# Patient Record
Sex: Male | Born: 1950
Health system: Southern US, Community
[De-identification: ages and names within clinical notes are randomized; demographics above are authoritative.]

## PROBLEM LIST (undated history)

## (undated) DIAGNOSIS — R7303 Prediabetes: Secondary | ICD-10-CM

## (undated) DIAGNOSIS — I10 Essential (primary) hypertension: Secondary | ICD-10-CM

## (undated) DIAGNOSIS — R972 Elevated prostate specific antigen [PSA]: Secondary | ICD-10-CM

## (undated) DIAGNOSIS — Z87442 Personal history of urinary calculi: Secondary | ICD-10-CM

## (undated) DIAGNOSIS — E785 Hyperlipidemia, unspecified: Secondary | ICD-10-CM

## (undated) DIAGNOSIS — N200 Calculus of kidney: Secondary | ICD-10-CM

## (undated) HISTORY — PX: INGUINAL HERNIA REPAIR: SUR1180

## (undated) HISTORY — DX: Hyperlipidemia, unspecified: E78.5

## (undated) HISTORY — PX: WISDOM TOOTH EXTRACTION: SHX21

## (undated) HISTORY — DX: Calculus of kidney: N20.0

---

## 2001-09-07 ENCOUNTER — Ambulatory Visit (HOSPITAL_COMMUNITY): Admission: RE | Admit: 2001-09-07 | Discharge: 2001-09-07 | Payer: Self-pay | Admitting: General Surgery

## 2006-04-06 ENCOUNTER — Ambulatory Visit: Payer: Self-pay | Admitting: Family Medicine

## 2006-06-20 ENCOUNTER — Ambulatory Visit: Payer: Self-pay | Admitting: Family Medicine

## 2006-08-11 ENCOUNTER — Ambulatory Visit: Payer: Self-pay | Admitting: Family Medicine

## 2006-10-21 ENCOUNTER — Ambulatory Visit: Payer: Self-pay | Admitting: Family Medicine

## 2007-01-17 ENCOUNTER — Ambulatory Visit: Payer: Self-pay | Admitting: Family Medicine

## 2007-03-14 ENCOUNTER — Ambulatory Visit: Payer: Self-pay | Admitting: Family Medicine

## 2007-04-27 HISTORY — PX: COLONOSCOPY: SHX174

## 2008-03-27 ENCOUNTER — Ambulatory Visit: Payer: Self-pay | Admitting: Family Medicine

## 2008-11-05 ENCOUNTER — Ambulatory Visit: Payer: Self-pay | Admitting: Family Medicine

## 2009-11-17 ENCOUNTER — Ambulatory Visit: Payer: Self-pay | Admitting: Family Medicine

## 2010-04-16 ENCOUNTER — Ambulatory Visit: Payer: Self-pay | Admitting: Family Medicine

## 2010-09-11 NOTE — Op Note (Signed)
[facility]  Patient:    Adam Rosales, Adam Rosales Visit Number: [id number] MRN: [id number]          Service Type: DSU Location: DAY Attending Physician:  Brandy Hale Dictated by:   Angelia Mould. Derrell Lolling, M.D. Proc. Date: 09/07/01 Admit Date:  09/07/2001   CC:         Ronnald Nian, M.D.   Operative Report  PREOPERATIVE DIAGNOSIS:  Bilateral inguinal hernias.  POSTOPERATIVE DIAGNOSIS:  Bilateral inguinal hernias.  OPERATION PERFORMED:  Laparoscopic preperitoneal repair of bilateral inguinal hernias with mesh.  SURGEON:  Angelia Mould. Derrell Lolling, M.D.  FIRST ASSISTANT:  Abigail Miyamoto, M.D.  INDICATIONS FOR PROCEDURE:  This is a 60 year old gentleman who presents with a recent history of a painful bulge in his right groin. This has been progressive. On exam, he actually has bilateral inguinal hernias that are completely reducible. He is brought to the operating room electively.  OPERATIVE FINDINGS:  The patient had a large indirect hernia on the right side and on the left side he had both and indirect and a direct hernia. These were simple hernias. There was no sliding component or evidence of incarceration.  DESCRIPTION OF PROCEDURE:  Following the induction of general endotracheal anesthesia, the patients bladder was emptied with a Foley catheter. The abdomen and genitalia were prepped and draped in a sterile fashion. A transverse incision was made at the lower end of the umbilicus. The fascia was incised transversely exposing the medial border of the right rectus muscle. A balloon dissector was passed gently and bluntly behind the right rectus muscle down to the symphysis pubis. The video camera was inserted and the dissector balloon was inflated under direct vision and held in place for about four minutes. We had reasonably good dissection centrally but laterally it was not completely dissected. The dissector balloon was removed. The  insufflating trocar was inflated and secured. The preperitoneal space was insufflated at 12 mmHg. We placed a 5 mm trocar in the infraumbilical midline just below the camera trocar and through that port, we took down some adhesions and dissected the peritoneum away from the lateral abdominal wall bilaterally. We inserted a 5 mm trocar in the right lower quadrant above the level of the anterior superior iliac spine.  We then dissected bilaterally. We identified the symphysis pubis and Coopers ligaments on both side. We identified the iliac vessels and inferior epigastric vessels. We dissected a direct hernia sac on the left side and the pseudosac was allowed to retract. We then dissected large indirect hernia sacs away from the cord structures bilaterally, this was done primarily with blunt dissection. We isolated the cord structures identifying the vas deferens and the testicular vessels and then dissected the indirect sac all the way back to where they were above the level of the anterior superior iliac spine.  On the right side, we repaired the hernia with a 4 inch x 6 inch piece of polypropylene mesh. On the left side, we used a pre-cut convex bard mesh, large size.  On both sides, we overlapped the midline just a little bit and secured the mesh to the superior rim of the symphysis pubis and then along the superior rim of Coopers ligament, up along the midline and then laterally above the iliopubic tract. This was done with a 5 mm tacker. Laterally, we were very careful to be able to to palpate the tacking device through the skin so as to avoid injury to sensory nerves. The mesh was overlapped  in the midline slightly and both pieces of mesh appeared to cover both the direct and indirect spaces quite generously. We had good lateral coverage as well on both sides.  There was no bleeding, everything looked fine. The trocars were removed, the pneumoperitoneum was released. The fascia  at the umbilicus was closed with interrupted figure-of-eight sutures of #0 Vicryl. The skin incisions were closed with subcuticular sutures of 4-0 Vicryl and Steri-Strips. Clean bandages were placed and the patient taken to the recovery room in stable condition. Estimated blood loss was about 20 cc. Complications none. Sponge, needle and instrument counts were correct. Dictated by:   Angelia Mould. Derrell Lolling, M.D. Attending Physician:  Brandy Hale DD:  09/07/01 TD:  09/08/01 Job: [id number]

## 2010-11-30 ENCOUNTER — Encounter: Payer: Self-pay | Admitting: Family Medicine

## 2010-12-01 ENCOUNTER — Encounter: Payer: Self-pay | Admitting: Family Medicine

## 2010-12-01 ENCOUNTER — Ambulatory Visit (INDEPENDENT_AMBULATORY_CARE_PROVIDER_SITE_OTHER): Payer: BC Managed Care – PPO | Admitting: Family Medicine

## 2010-12-01 VITALS — BP 120/84 | HR 66 | Ht 70.0 in | Wt 214.0 lb

## 2010-12-01 DIAGNOSIS — Z0000 Encounter for general adult medical examination without abnormal findings: Secondary | ICD-10-CM

## 2010-12-01 DIAGNOSIS — E785 Hyperlipidemia, unspecified: Secondary | ICD-10-CM

## 2010-12-01 DIAGNOSIS — Z8042 Family history of malignant neoplasm of prostate: Secondary | ICD-10-CM

## 2010-12-01 LAB — POCT URINALYSIS DIPSTICK
Blood, UA: NEGATIVE
Nitrite, UA: NEGATIVE
Protein, UA: NEGATIVE
Urobilinogen, UA: NEGATIVE
pH, UA: 7

## 2010-12-01 MED ORDER — ATORVASTATIN CALCIUM 20 MG PO TABS: 20.0000 mg | ORAL_TABLET | Freq: Every day | ORAL | Status: DC

## 2010-12-01 NOTE — Progress Notes (Signed)
  Subjective:    Patient ID: Adam Rosales, male    DOB: [birth year], 60 y.o.   MRN: [id number]  HPI He is here for a complete examination. He has had some difficulty with urinary frequency but this bothers him mainly during the day. He does state that the urine is relatively clear and advanced to drinking lots of fluids. He has no other concerns or complaints He continues on his Lipitor as well as multivitamins, omega-3 and aspirin. His work and home life are going well. He does not smoke and rarely drinks. Family history significant for prostate cancer. He is also made some exercise and dietary changes and has lost a significant amount of weight which is quite happy with.   Review of Systems  Constitutional: Negative.   HENT: Negative.   Eyes: Negative.   Respiratory: Negative.   Cardiovascular: Negative.   Gastrointestinal: Negative.   Genitourinary: Positive for frequency.  Musculoskeletal: Negative.   Skin: Negative.   Neurological: Negative.   Hematological: Negative.   Psychiatric/Behavioral: Negative.        Objective:   Physical Exam BP 120/84  Pulse 66  Ht 5\' 10"  (1.778 m)  Wt 214 lb (97.07 kg)  BMI 30.71 kg/m2  General Appearance:    Alert, cooperative, no distress, appears stated age  Head:    Normocephalic, without obvious abnormality, atraumatic  Eyes:    PERRL, conjunctiva/corneas clear, EOM's intact, fundi    benign  Ears:    Normal TM's and external ear canals  Nose:   Nares normal, mucosa normal, no drainage or sinus   tenderness  Throat:   Lips, mucosa, and tongue normal; teeth and gums normal  Neck:   Supple, no lymphadenopathy;  thyroid:  no   enlargement/tenderness/nodules; no carotid   bruit or JVD  Back:    Spine nontender, no curvature, ROM normal, no CVA     tenderness  Lungs:     Clear to auscultation bilaterally without wheezes, rales or     ronchi; respirations unlabored  Chest Wall:    No tenderness or deformity   Heart:    Regular rate and  rhythm, S1 and S2 normal, no murmur, rub   or gallop  Breast Exam:    No chest wall tenderness, masses or gynecomastia  Abdomen:     Soft, non-tender, nondistended, normoactive bowel sounds,    no masses, no hepatosplenomegaly  Genitalia:    Normal male external genitalia without lesions.  Testicles without masses.  No inguinal hernias.  Rectal:    Normal sphincter tone, no masses or tenderness; guaiac negative stool.  Prostate smooth, no nodules, not enlarged.  Extremities:   No clubbing, cyanosis or edema  Pulses:   2+ and symmetric all extremities  Skin:   Skin color, texture, turgor normal, no rashes or lesions  Lymph nodes:   Cervical, supraclavicular, and axillary nodes normal  Neurologic:   CNII-XII intact, normal strength, sensation and gait; reflexes 2+ and symmetric throughout          Psych:   Normal mood, affect, hygiene and grooming.           Assessment & Plan:  Hyperlipidemia. Family history of prostate cancer. Scheduled weight loss. Routine blood screening. Lipitor will be renewed

## 2010-12-02 ENCOUNTER — Telehealth: Payer: Self-pay

## 2010-12-02 LAB — CBC WITH DIFFERENTIAL/PLATELET
Eosinophils Absolute: 0.5 10*3/uL (ref 0.0–0.7)
Eosinophils Relative: 8 % — ABNORMAL HIGH (ref 0–5)
Lymphs Abs: 2.1 10*3/uL (ref 0.7–4.0)
MCH: 31 pg (ref 26.0–34.0)
MCV: 93.1 fL (ref 78.0–100.0)
Platelets: 249 10*3/uL (ref 150–400)
RBC: 4.78 MIL/uL (ref 4.22–5.81)
RDW: 13.5 % (ref 11.5–15.5)

## 2010-12-02 LAB — LIPID PANEL
Cholesterol: 114 mg/dL (ref 0–200)
HDL: 38 mg/dL — ABNORMAL LOW (ref >39)
Total CHOL/HDL Ratio: 3 Ratio
Triglycerides: 89 mg/dL (ref <150)
VLDL: 18 mg/dL (ref 0–40)

## 2010-12-02 LAB — COMPREHENSIVE METABOLIC PANEL
ALT: 15 U/L (ref 0–53)
CO2: 28 mEq/L (ref 19–32)
Creat: 0.82 mg/dL (ref 0.50–1.35)
Total Bilirubin: 0.7 mg/dL (ref 0.3–1.2)

## 2010-12-02 NOTE — Telephone Encounter (Signed)
Left word with daughter about labs

## 2011-07-05 ENCOUNTER — Other Ambulatory Visit (INDEPENDENT_AMBULATORY_CARE_PROVIDER_SITE_OTHER): Payer: BC Managed Care – PPO

## 2011-07-05 DIAGNOSIS — Z23 Encounter for immunization: Secondary | ICD-10-CM

## 2011-07-05 DIAGNOSIS — Z2911 Encounter for prophylactic immunotherapy for respiratory syncytial virus (RSV): Secondary | ICD-10-CM

## 2011-12-27 ENCOUNTER — Other Ambulatory Visit: Payer: Self-pay | Admitting: Family Medicine

## 2012-02-01 ENCOUNTER — Other Ambulatory Visit: Payer: Self-pay | Admitting: Family Medicine

## 2012-02-04 ENCOUNTER — Telehealth: Payer: Self-pay | Admitting: Internal Medicine

## 2012-02-04 MED ORDER — ATORVASTATIN CALCIUM 20 MG PO TABS: 20.0000 mg | ORAL_TABLET | Freq: Every day | ORAL | Status: DC

## 2012-02-04 NOTE — Telephone Encounter (Signed)
Me sent in 

## 2012-02-24 ENCOUNTER — Ambulatory Visit (INDEPENDENT_AMBULATORY_CARE_PROVIDER_SITE_OTHER): Payer: BC Managed Care – PPO | Admitting: Family Medicine

## 2012-02-24 VITALS — BP 130/80 | HR 74 | Wt 250.0 lb

## 2012-02-24 DIAGNOSIS — E782 Mixed hyperlipidemia: Secondary | ICD-10-CM | POA: Insufficient documentation

## 2012-02-24 DIAGNOSIS — E785 Hyperlipidemia, unspecified: Secondary | ICD-10-CM | POA: Insufficient documentation

## 2012-02-24 DIAGNOSIS — L259 Unspecified contact dermatitis, unspecified cause: Secondary | ICD-10-CM

## 2012-02-24 MED ORDER — METHYLPREDNISOLONE SODIUM SUCC 125 MG IJ SOLR: 125.0000 mg | Freq: Once | INTRAMUSCULAR | Status: DC

## 2012-02-24 MED ORDER — ATORVASTATIN CALCIUM 20 MG PO TABS: 20.0000 mg | ORAL_TABLET | Freq: Every day | ORAL | Status: DC

## 2012-02-24 MED ORDER — METHYLPREDNISOLONE SODIUM SUCC 125 MG IJ SOLR
125.0000 mg | Freq: Once | INTRAMUSCULAR | Status: AC
  Administered 2012-02-24: 125 mg via INTRAMUSCULAR

## 2012-02-24 NOTE — Progress Notes (Signed)
  Subjective:    Patient ID: Adam Rosales, male    DOB: April 15, 1951, 61 y.o.   MRN: [id number]  HPI He is here for consultation concerning a rash present mainly on his abdomen. He has no exposure to new chemicals, soaps, detergents. He did do some yard work recently. He also would like a refill on his Lipitor. He does have an appointment scheduled in the near future.  Review of Systems     Objective:   Physical Exam Diffuse erythematous confluent rash on the abdomen with clear delineation and a diagonal pattern bilaterally. He also has an erythematous type rash present on his right forearm.       Assessment & Plan:   1. Contact dermatitis  methylPREDNISolone sodium succinate (SOLU-MEDROL) 125 mg/2 mL injection 125 mg, DISCONTINUED: methylPREDNISolone sodium succinate (SOLU-MEDROL) 125 mg/2 mL injection  2. Hyperlipidemia LDL goal < 100  atorvastatin (LIPITOR) 20 MG tablet  The rash is mostly consistent with a contact irritation. If no improvement with the steroid, referral will be made.

## 2012-02-24 NOTE — Patient Instructions (Signed)
Use Lotrisone on the genital lesions. He can take Benadryl at night for the itching

## 2012-03-07 ENCOUNTER — Encounter: Payer: Self-pay | Admitting: Internal Medicine

## 2012-03-09 ENCOUNTER — Encounter: Payer: BC Managed Care – PPO | Admitting: Family Medicine

## 2012-03-15 ENCOUNTER — Ambulatory Visit (INDEPENDENT_AMBULATORY_CARE_PROVIDER_SITE_OTHER): Payer: BC Managed Care – PPO | Admitting: Family Medicine

## 2012-03-15 ENCOUNTER — Encounter: Payer: Self-pay | Admitting: Family Medicine

## 2012-03-15 VITALS — BP 116/78 | HR 60

## 2012-03-15 DIAGNOSIS — E785 Hyperlipidemia, unspecified: Secondary | ICD-10-CM

## 2012-03-15 DIAGNOSIS — E669 Obesity, unspecified: Secondary | ICD-10-CM | POA: Insufficient documentation

## 2012-03-15 DIAGNOSIS — Z0000 Encounter for general adult medical examination without abnormal findings: Secondary | ICD-10-CM

## 2012-03-15 DIAGNOSIS — Z8042 Family history of malignant neoplasm of prostate: Secondary | ICD-10-CM

## 2012-03-15 LAB — HEMOCCULT GUIAC POC 1CARD (OFFICE)

## 2012-03-15 LAB — CBC WITH DIFFERENTIAL/PLATELET
Basophils Relative: 1 % (ref 0–1)
Eosinophils Relative: 5 % (ref 0–5)
HCT: 48.7 % (ref 39.0–52.0)
Hemoglobin: 17.4 g/dL — ABNORMAL HIGH (ref 13.0–17.0)
MCH: 32.2 pg (ref 26.0–34.0)
MCHC: 35.7 g/dL (ref 30.0–36.0)
MCV: 90 fL (ref 78.0–100.0)
Monocytes Absolute: 0.5 10*3/uL (ref 0.1–1.0)
Monocytes Relative: 9 % (ref 3–12)
Neutro Abs: 2.8 10*3/uL (ref 1.7–7.7)

## 2012-03-15 LAB — POCT URINALYSIS DIPSTICK
Ketones, UA: NEGATIVE
Leukocytes, UA: NEGATIVE
Protein, UA: NEGATIVE
Urobilinogen, UA: NEGATIVE
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
## 2012-03-16 LAB — COMPREHENSIVE METABOLIC PANEL
Albumin: 4.6 g/dL (ref 3.5–5.2)
Alkaline Phosphatase: 57 U/L (ref 39–117)
BUN: 10 mg/dL (ref 6–23)
Calcium: 9.3 mg/dL (ref 8.4–10.5)
Chloride: 105 mEq/L (ref 96–112)
Glucose, Bld: 91 mg/dL (ref 70–99)
Potassium: 4.3 mEq/L (ref 3.5–5.3)

## 2012-03-16 LAB — LIPID PANEL
Cholesterol: 121 mg/dL (ref 0–200)
Total CHOL/HDL Ratio: 3.5 Ratio

## 2012-03-16 NOTE — Progress Notes (Signed)
Quick Note:  Called pt home # left message labs including PSA look good ______

## 2012-03-30 ENCOUNTER — Other Ambulatory Visit: Payer: Self-pay | Admitting: Family Medicine

## 2012-05-01 ENCOUNTER — Ambulatory Visit (INDEPENDENT_AMBULATORY_CARE_PROVIDER_SITE_OTHER): Payer: BC Managed Care – PPO | Admitting: Family Medicine

## 2012-05-01 ENCOUNTER — Encounter: Payer: Self-pay | Admitting: Family Medicine

## 2012-05-01 VITALS — BP 136/80 | HR 58 | Temp 98.5°F | Wt 245.0 lb

## 2012-05-01 DIAGNOSIS — J069 Acute upper respiratory infection, unspecified: Secondary | ICD-10-CM

## 2012-05-01 NOTE — Progress Notes (Signed)
  Subjective:    Patient ID: Adam Rosales, male    DOB: 1950/06/08, 62 y.o.   MRN: [id number]  HPI 4 days ago he started with a slight sore throat followed by dry cough, runny nose, fatigue. He had the start on a flight back from [place].  Review of Systems     Objective:   Physical Exam alert and in no distress. Tympanic membranes and canals are normal. Throat is clear. Tonsils are normal. Neck is supple without adenopathy or thyromegaly. Cardiac exam shows a regular sinus rhythm without murmurs or gallops. Lungs are clear to auscultation.        Assessment & Plan:   1. URI, acute    recommend supportive care. Call after 7-10 days if no improvement.

## 2012-11-20 ENCOUNTER — Ambulatory Visit (INDEPENDENT_AMBULATORY_CARE_PROVIDER_SITE_OTHER): Payer: BC Managed Care – PPO | Admitting: Family Medicine

## 2012-11-20 VITALS — BP 130/98 | HR 79 | Temp 97.9°F | Resp 18 | Ht 71.0 in | Wt 249.2 lb

## 2012-11-20 DIAGNOSIS — S91109A Unspecified open wound of unspecified toe(s) without damage to nail, initial encounter: Secondary | ICD-10-CM

## 2012-11-20 DIAGNOSIS — S91209A Unspecified open wound of unspecified toe(s) with damage to nail, initial encounter: Secondary | ICD-10-CM

## 2012-11-20 DIAGNOSIS — L603 Nail dystrophy: Secondary | ICD-10-CM

## 2012-11-20 DIAGNOSIS — L608 Other nail disorders: Secondary | ICD-10-CM

## 2012-11-20 DIAGNOSIS — M79674 Pain in right toe(s): Secondary | ICD-10-CM

## 2012-11-20 DIAGNOSIS — M79609 Pain in unspecified limb: Secondary | ICD-10-CM

## 2012-11-20 NOTE — Progress Notes (Signed)
Procedure:  Consent obtained.  2% lido digital block for anesthesia.  Betadine prep - nail removed - xeroform gauze placed on nail bed.  Drsg placed.  Wound care d/w pt.

## 2012-11-20 NOTE — Progress Notes (Signed)
   [address]   [place], [place]  [address]   [phone]  Subjective:    Patient ID: Adam Rosales, male    DOB: 1950-11-16, 62 y.o.   MRN: [id number]  HPI This 62 y.o. male presents for evaluation of R first digit toe pain due to avulsion partial of nail.  Got up last night and stubbed toe; +nail lifted up and off. Pt cut off distal part of nail; remaining nail lifts off of nailbed.  Tender.  Would like to have remaining nail resected.  No toe swelling.  +bleeding last night mild.  +dystrophic nail chronic; concerned about fungus.  Applied topical antifungal cream in past with temporary improvement.    Review of Systems  Constitutional: Negative for chills, diaphoresis and fatigue.  Skin: Positive for color change and wound.    Past Medical History  Diagnosis Date  . Dyslipidemia     Past Surgical History  Procedure Laterality Date  . Colonoscopy  2009    Dr.Mann  . Hernia repair      Prior to Admission medications   Medication Sig Start Date End Date Taking? Authorizing Provider  aspirin 81 MG tablet Take 81 mg by mouth daily.     Yes Historical Provider, MD  atorvastatin (LIPITOR) 20 MG tablet TAKE ONE TABLET BY MOUTH DAILY 03/30/12  Yes Ronnald Nian, MD  beta carotene w/minerals (OCUVITE) tablet Take 1 tablet by mouth daily.   Yes Historical Provider, MD  fish oil-omega-3 fatty acids 1000 MG capsule Take 2 g by mouth daily.     Yes Historical Provider, MD    No Known Allergies  History   Social History  . Marital Status: Married    Spouse Name: N/A    Number of Children: N/A  . Years of Education: N/A   Occupational History  . Not on file.   Social History Main Topics  . Smoking status: Never Smoker   . Smokeless tobacco: Never Used  . Alcohol Use: Yes     Comment: rare  . Drug Use: No  . Sexually Active: Yes   Other Topics Concern  . Not on file   Social History Narrative  . No narrative on file    Family History  Problem Relation Age of  Onset  . Heart disease Father 8    MI  . Cancer Brother     Prostate and colon       Objective:   Physical Exam  Nursing note and vitals reviewed. Constitutional: He appears well-developed and well-nourished.  Skin:  R first toe with dystrophic nail with nail avulsion and residual blood.  Psychiatric: He has a normal mood and affect. His behavior is normal.   PROCEDURE: SEE SEPARATE PROCEDURE NOTE.    Assessment & Plan:  Pain in toe of right foot  Dystrophic nail - Plan: Culture, fungus without smear  Toenail avulsion, initial encounter  1. Pain toe R first:  New. Secondary to partial nail avulsion.  Recommend Tylenol or Motrin. 2.  Dystrophic nail R first toe:  New.  Send fungal culture. 3.  Avulsion R toenail first:  New.  S/p remaining toenail resection; local wound care.

## 2012-11-20 NOTE — Patient Instructions (Addendum)
INGROWN TOENAIL . Keep area clean, dry and bandaged for 24 hours. . After 24 hours, remove outer bandage and leave yellow gauze in place. . Soak toe/foot in warm soapy water for 5-10 minutes, once daily for 5 days. Rebandage toe after each cleaning. . Continue soaks until yellow gauze falls off. . Notify the office if you experience any of the following signs of infection: Swelling, redness, pus drainage, streaking, fever > 101.0 F   

## 2013-01-31 ENCOUNTER — Other Ambulatory Visit: Payer: Self-pay

## 2013-01-31 MED ORDER — ATORVASTATIN CALCIUM 20 MG PO TABS: ORAL_TABLET | ORAL | Status: DC

## 2013-01-31 NOTE — Telephone Encounter (Signed)
SENT IN LIPITOR PT NEEDS MED CHECK

## 2013-06-24 ENCOUNTER — Encounter (HOSPITAL_COMMUNITY): Payer: Self-pay | Admitting: Emergency Medicine

## 2013-06-24 ENCOUNTER — Emergency Department (HOSPITAL_COMMUNITY)
Admission: EM | Admit: 2013-06-24 | Discharge: 2013-06-24 | Disposition: A | Discharge status: Home / Self Care | Payer: BC Managed Care – PPO | Attending: Emergency Medicine | Admitting: Emergency Medicine

## 2013-06-24 DIAGNOSIS — Z7982 Long term (current) use of aspirin: Secondary | ICD-10-CM | POA: Insufficient documentation

## 2013-06-24 DIAGNOSIS — I493 Ventricular premature depolarization: Secondary | ICD-10-CM

## 2013-06-24 DIAGNOSIS — Z79899 Other long term (current) drug therapy: Secondary | ICD-10-CM | POA: Insufficient documentation

## 2013-06-24 DIAGNOSIS — I4949 Other premature depolarization: Secondary | ICD-10-CM | POA: Insufficient documentation

## 2013-06-24 DIAGNOSIS — R55 Syncope and collapse: Secondary | ICD-10-CM | POA: Insufficient documentation

## 2013-06-24 DIAGNOSIS — E860 Dehydration: Secondary | ICD-10-CM | POA: Insufficient documentation

## 2013-06-24 DIAGNOSIS — E785 Hyperlipidemia, unspecified: Secondary | ICD-10-CM | POA: Insufficient documentation

## 2013-06-24 LAB — BASIC METABOLIC PANEL
BUN: 11 mg/dL (ref 6–23)
CO2: 24 meq/L (ref 19–32)
Calcium: 8.7 mg/dL (ref 8.4–10.5)
Chloride: 102 mEq/L (ref 96–112)
Creatinine, Ser: 0.98 mg/dL (ref 0.50–1.35)
GFR calc Af Amer: >90 mL/min (ref >90)
GFR calc non Af Amer: 86 mL/min — ABNORMAL LOW (ref >90)
GLUCOSE: 113 mg/dL — AB (ref 70–99)
Potassium: 3.9 mEq/L (ref 3.7–5.3)
SODIUM: 140 meq/L (ref 137–147)

## 2013-06-24 LAB — I-STAT CHEM 8, ED
BUN: 10 mg/dL (ref 6–23)
CREATININE: 1 mg/dL (ref 0.50–1.35)
Calcium, Ion: 1.08 mmol/L — ABNORMAL LOW (ref 1.13–1.30)
Chloride: 108 mEq/L (ref 96–112)
Glucose, Bld: 112 mg/dL — ABNORMAL HIGH (ref 70–99)
HCT: 49 % (ref 39.0–52.0)
Hemoglobin: 16.7 g/dL (ref 13.0–17.0)
POTASSIUM: 3.4 meq/L — AB (ref 3.7–5.3)
Sodium: 140 mEq/L (ref 137–147)
TCO2: 26 mmol/L (ref 0–100)

## 2013-06-24 LAB — CBC
HCT: 47.1 % (ref 39.0–52.0)
HEMOGLOBIN: 17.4 g/dL — AB (ref 13.0–17.0)
MCH: 32.7 pg (ref 26.0–34.0)
MCHC: 36.9 g/dL — ABNORMAL HIGH (ref 30.0–36.0)
MCV: 88.5 fL (ref 78.0–100.0)
Platelets: 239 10*3/uL (ref 150–400)
RBC: 5.32 MIL/uL (ref 4.22–5.81)
RDW: 12.7 % (ref 11.5–15.5)
WBC: 9 10*3/uL (ref 4.0–10.5)

## 2013-06-24 NOTE — ED Notes (Signed)
Pt ambulated in hallway, denies dizziness/lightheadedness with ambulating. MD notified.

## 2013-06-24 NOTE — ED Notes (Signed)
Pt was sitting down at church and suddenly felt lightheaded, sweaty, and tingling in his R hand. He told his wife he felt he may pass out, she said he looked clammy at the time but never lost consciousness. He states symptoms are better now but still "feels like somethings not right." he is a&ox4, denies pain

## 2013-06-24 NOTE — ED Notes (Addendum)
Pt reports while he was sitting in church, he started to feel lightheaded, became sweaty had tingling in right and left hand but more in the right. Denies nausea feeling. Pt reports he felt like he had stood up too quickly but he was sitting. Denies LOC.  Now pt reports he feels a lot better just some generalized weakness. Nad, skin warm and dry, resp e/u. No facial droop, no slurred speech, no difficulties ambulating, equal hand grips.

## 2013-06-24 NOTE — ED Notes (Signed)
Pt reports he is feeling a little better, sts that he is just a little tired. Reports he didn't get any sleep last night.

## 2013-06-24 NOTE — Discharge Instructions (Signed)
The testing today indicated mild dehydration that is likely nutritional, in origin. Try to eat regularly, and get plenty of rest. There were a few PVCs on the EKG and cardiac monitor. These are typically a benign condition. Discuss these issues with your primary care doctor, when you see him, in a couple of weeks.    Dehydration, Adult Dehydration is when you lose more fluids from the body than you take in. Vital organs like the kidneys, brain, and heart cannot function without a proper amount of fluids and salt. Any loss of fluids from the body can cause dehydration.  CAUSES   Vomiting.  Diarrhea.  Excessive sweating.  Excessive urine output.  Fever. SYMPTOMS  Mild dehydration  Thirst.  Dry lips.  Slightly dry mouth. Moderate dehydration  Very dry mouth.  Sunken eyes.  Skin does not bounce back quickly when lightly pinched and released.  Dark urine and decreased urine production.  Decreased tear production.  Headache. Severe dehydration  Very dry mouth.  Extreme thirst.  Rapid, weak pulse (more than 100 beats per minute at rest).  Cold hands and feet.  Not able to sweat in spite of heat and temperature.  Rapid breathing.  Blue lips.  Confusion and lethargy.  Difficulty being awakened.  Minimal urine production.  No tears. DIAGNOSIS  Your caregiver will diagnose dehydration based on your symptoms and your exam. Blood and urine tests will help confirm the diagnosis. The diagnostic evaluation should also identify the cause of dehydration. TREATMENT  Treatment of mild or moderate dehydration can often be done at home by increasing the amount of fluids that you drink. It is best to drink small amounts of fluid more often. Drinking too much at one time can make vomiting worse. Refer to the home care instructions below. Severe dehydration needs to be treated at the hospital where you will probably be given intravenous (IV) fluids that contain water and  electrolytes. HOME CARE INSTRUCTIONS   Ask your caregiver about specific rehydration instructions.  Drink enough fluids to keep your urine clear or pale yellow.  Drink small amounts frequently if you have nausea and vomiting.  Eat as you normally do.  Avoid:  Foods or drinks high in sugar.  Carbonated drinks.  Juice.  Extremely hot or cold fluids.  Drinks with caffeine.  Fatty, greasy foods.  Alcohol.  Tobacco.  Overeating.  Gelatin desserts.  Wash your hands well to avoid spreading bacteria and viruses.  Only take over-the-counter or prescription medicines for pain, discomfort, or fever as directed by your caregiver.  Ask your caregiver if you should continue all prescribed and over-the-counter medicines.  Keep all follow-up appointments with your caregiver. SEEK MEDICAL CARE IF:  You have abdominal pain and it increases or stays in one area (localizes).  You have a rash, stiff neck, or severe headache.  You are irritable, sleepy, or difficult to awaken.  You are weak, dizzy, or extremely thirsty. SEEK IMMEDIATE MEDICAL CARE IF:   You are unable to keep fluids down or you get worse despite treatment.  You have frequent episodes of vomiting or diarrhea.  You have blood or green matter (bile) in your vomit.  You have blood in your stool or your stool looks black and tarry.  You have not urinated in 6 to 8 hours, or you have only urinated a small amount of very dark urine.  You have a fever.  You faint. MAKE SURE YOU:   Understand these instructions.  Will watch your condition.  Will get help right away if you are not doing well or get worse. Document Released: 04/12/2005 Document Revised: 07/05/2011 Document Reviewed: 11/30/2010 [facility] Patient Information 2014 [place], [place].  Near-Syncope Near-syncope (commonly known as near fainting) is sudden weakness, dizziness, or feeling like you might pass out. During an episode of near-syncope, you  may also develop pale skin, have tunnel vision, or feel sick to your stomach (nauseous). Near-syncope may occur when getting up after sitting or while standing for a long time. It is caused by a sudden decrease in blood flow to the brain. This decrease can result from various causes or triggers, most of which are not serious. However, because near-syncope can sometimes be a sign of something serious, a medical evaluation is required. The specific cause is often not determined. HOME CARE INSTRUCTIONS  Monitor your condition for any changes. The following actions may help to alleviate any discomfort you are experiencing:  Have someone stay with you until you feel stable.  Lie down right away if you start feeling like you might faint. Breathe deeply and steadily. Wait until all the symptoms have passed. Most of these episodes last only a few minutes. You may feel tired for several hours.   Drink enough fluids to keep your urine clear or pale yellow.   If you are taking blood pressure or heart medicine, get up slowly when seated or lying down. Take several minutes to sit and then stand. This can reduce dizziness.  Follow up with your health care provider as directed. SEEK IMMEDIATE MEDICAL CARE IF:   You have a severe headache.   You have unusual pain in the chest, abdomen, or back.   You are bleeding from the mouth or rectum, or you have black or tarry stool.   You have an irregular or very fast heartbeat.   You have repeated fainting or have seizure-like jerking during an episode.   You faint when sitting or lying down.   You have confusion.   You have difficulty walking.   You have severe weakness.   You have vision problems.  MAKE SURE YOU:   Understand these instructions.  Will watch your condition.  Will get help right away if you are not doing well or get worse. Document Released: 04/12/2005 Document Revised: 12/13/2012 Document Reviewed: 09/15/2012 [facility]  Patient Information 2014 [place].  Premature Beats A premature beat is an extra heartbeat that happens earlier than normal. Premature beats are called premature atrial contractions (PACs) or premature ventricular contractions (PVCs) depending on the area of the heart where they start. CAUSES  Premature beats may be brought on by a variety of factors including:  Emotional stress.  Lack of sleep.  Caffeine.  Asthma medicines.  Stimulants.  Herbal teas.  Dietary supplements.  Alcohol. In most cases, premature beats are not dangerous and are not a sign of serious heart disease. Most patients evaluated for premature beats have completely normal heart function. Rarely, premature beats may be a sign of more significant heart problems or medical illness. SYMPTOMS  Premature beats may cause palpitations. This means you feel like your heart is skipping a beat or beating harder than usual. Sometimes, slight chest pain occurs with premature beats, lasting only a few seconds. This pain has been described as a "flopping" feeling inside the chest. In many cases, premature beats do not cause any symptoms and they are only detected when an electrocardiography test (EKG) or heart monitoring is performed. DIAGNOSIS  Your caregiver may run some  tests to evaluate your heart such as an EKG or echocardiography. You may need to wear a portable heart monitor for several days to record the electrical activity of your heart. Blood testing may also be performed to check your electrolytes and thyroid function. TREATMENT  Premature beats usually go away with rest. If the problem continues, your caregiver will determine a treatment plan for you.  HOME CARE INSTRUCTIONS  Get plenty of rest over the next few days until your symptoms improve.  Avoid coffee, tea, alcohol, and soda (pop, cola).  Do not smoke. SEEK MEDICAL CARE IF:  Your symptoms continue after 1 to 2 days of rest.  You have new symptoms,  such as chest pain or trouble breathing. SEEK IMMEDIATE MEDICAL CARE IF:  You have severe chest pain or abdominal pain.  You have pain that radiates into the neck, arm, or jaw.  You faint or have extreme weakness.  You have shortness of breath.  Your heartbeat races for more than 5 seconds. MAKE SURE YOU:  Understand these instructions.  Will watch your condition.  Will get help right away if you are not doing well or get worse. Document Released: 05/20/2004 Document Revised: 07/05/2011 Document Reviewed: 12/14/2010 [facility] Patient Information 2014 [place], [place].

## 2013-06-24 NOTE — ED Provider Notes (Signed)
CSN: [id number]     Arrival date & time 06/24/13  1213 History   First MD Initiated Contact with Patient 06/24/13 1233     Chief Complaint  Patient presents with  . Near Syncope     (Consider location/radiation/quality/duration/timing/severity/associated sxs/prior Treatment) Patient is a 63 y.o. male presenting with near-syncope. The history is provided by the patient.  Near Syncope   He was sitting in church listening to someone when he felt weak. He did not have associated headache, chest pain, diaphoresis, or dyspnea. He was able to leave the church in a wheelchair, then come here by private vehicle. On his way in to the ED, he was able to walk without problems. He has never had this previously. He has not been otherwise ill recently. He has had decreased ability to sleep over the last week and feels like he is worried about stressful situations in his life, currently. He's never had hypertension. He denies cardiac history. He does not have any known sick contacts. There are no other known modifying factors.   Past Medical History  Diagnosis Date  . Dyslipidemia    Past Surgical History  Procedure Laterality Date  . Colonoscopy  2009    Dr.Mann  . Hernia repair     Family History  Problem Relation Age of Onset  . Heart disease Father 36    MI  . Cancer Brother     Prostate and colon   History  Substance Use Topics  . Smoking status: Never Smoker   . Smokeless tobacco: Never Used  . Alcohol Use: Yes     Comment: rare    Review of Systems  Cardiovascular: Positive for near-syncope.  All other systems reviewed and are negative.      Allergies  Review of patient's allergies indicates no known allergies.  Home Medications   Current Outpatient Rx  Name  Route  Sig  Dispense  Refill  . aspirin EC 81 MG tablet   Oral   Take 81 mg by mouth daily.         Marland Kitchen atorvastatin (LIPITOR) 20 MG tablet   Oral   Take 20 mg by mouth daily.         . beta carotene  w/minerals (OCUVITE) tablet   Oral   Take 1 tablet by mouth daily.         . fish oil-omega-3 fatty acids 1000 MG capsule   Oral   Take 1 g by mouth daily.           BP 127/87  Pulse 69  Temp(Src) 97.9 F (36.6 C) (Oral)  Resp 16  Ht 5\' 10"  (1.778 m)  Wt 242 lb (109.77 kg)  BMI 34.72 kg/m2  SpO2 98% Physical Exam  Nursing note and vitals reviewed. Constitutional: He is oriented to person, place, and time. He appears well-developed and well-nourished.  HENT:  Head: Normocephalic and atraumatic.  Right Ear: External ear normal.  Left Ear: External ear normal.  Eyes: Conjunctivae and EOM are normal. Pupils are equal, round, and reactive to light.  Neck: Normal range of motion and phonation normal. Neck supple.  Cardiovascular: Normal rate, regular rhythm, normal heart sounds and intact distal pulses.   Pulmonary/Chest: Effort normal and breath sounds normal. He exhibits no bony tenderness.  Abdominal: Soft. Normal appearance. There is no tenderness.  Musculoskeletal: Normal range of motion.  Neurological: He is alert and oriented to person, place, and time. No cranial nerve deficit or sensory deficit. He exhibits  normal muscle tone. Coordination normal.  No dysarthria, aphasia or nystagmus. Normal finger to toe and heel to shin bilaterally  Skin: Skin is warm, dry and intact.  Psychiatric: He has a normal mood and affect. His behavior is normal. Judgment and thought content normal.    ED Course  Procedures (including critical care time) Medications - No data to display  Patient Vitals for the past 24 hrs:  BP Temp Temp src Pulse Resp SpO2 Height Weight  06/24/13 1430 127/87 mmHg - - 69 16 98 % - -  06/24/13 1400 122/84 mmHg - - 67 11 96 % - -  06/24/13 1330 122/89 mmHg - - 73 18 96 % - -  06/24/13 1300 102/86 mmHg - - 69 10 96 % - -  06/24/13 1230 93/67 mmHg - - - 15 97 % - -  06/24/13 1226 95/66 mmHg 97.9 F (36.6 C) Oral - 17 95 % 5\' 10"  (1.778 m) 242 lb (109.77  kg)    2:57 PM Reevaluation with update and discussion. After initial assessment and treatment, an updated evaluation reveals he feels well and has tolerated oral fluids and food. Findings discussed with patient and family, limitation of evaluation , discussed; and all questions answered. Joleen Stuckert L    Labs Review Labs Reviewed  CBC - Abnormal; Notable for the following:    Hemoglobin 17.4 (*)    MCHC 36.9 (*)    All other components within normal limits  BASIC METABOLIC PANEL - Abnormal; Notable for the following:    Glucose, Bld 113 (*)    GFR calc non Af Amer 86 (*)    All other components within normal limits  I-STAT CHEM 8, ED - Abnormal; Notable for the following:    Potassium 3.4 (*)    Glucose, Bld 112 (*)    Calcium, Ion 1.08 (*)    All other components within normal limits   Imaging Review No results found.   EKG Interpretation   Date/Time:  Sunday June 24 2013 12:17:45 EST Ventricular Rate:  87 PR Interval:  158 QRS Duration: 104 QT Interval:  362 QTC Calculation: 435 R Axis:   -26 Text Interpretation:  Sinus rhythm with frequent Premature ventricular  complexes Cannot rule out Anterior infarct , age undetermined Abnormal ECG  Since last tracing PVC are new Confirmed by [facility]  MD, Tifanie Gardiner [id number]) on  06/24/2013 12:32:59 PM      MDM   Final diagnoses:  Near syncope  Dehydration  PVC (premature ventricular contraction)    Near-syncope with mild dehydration, proved to treatment. Incidental, and unrelated, PVCs, uncovered. Doubt serious bacterial infection, metabolic instability, ACS, or impending vascular collapse.   Nursing Notes Reviewed/ Care Coordinated Applicable Imaging Reviewed Interpretation of Laboratory Data incorporated into ED treatment  The patient appears reasonably screened and/or stabilized for discharge and I doubt any other medical condition or other [facility] requiring further screening, evaluation, or treatment in the ED at this time  prior to discharge.  Plan: Home Medications- usual; Home Treatments- rest, fluids; return here if the recommended treatment, does not improve the symptoms; Recommended follow up- PCP, for checkup in 1 or 2 weeks     Richarda Blade, MD 06/24/13 1459

## 2013-07-02 ENCOUNTER — Ambulatory Visit (INDEPENDENT_AMBULATORY_CARE_PROVIDER_SITE_OTHER): Payer: BC Managed Care – PPO | Admitting: Family Medicine

## 2013-07-02 ENCOUNTER — Encounter: Payer: Self-pay | Admitting: Family Medicine

## 2013-07-02 VITALS — BP 92/54 | HR 68 | Ht 71.0 in | Wt 248.0 lb

## 2013-07-02 DIAGNOSIS — I4949 Other premature depolarization: Secondary | ICD-10-CM

## 2013-07-02 DIAGNOSIS — I959 Hypotension, unspecified: Secondary | ICD-10-CM

## 2013-07-02 DIAGNOSIS — I493 Ventricular premature depolarization: Secondary | ICD-10-CM

## 2013-07-02 DIAGNOSIS — Z0000 Encounter for general adult medical examination without abnormal findings: Secondary | ICD-10-CM

## 2013-07-02 LAB — POCT URINALYSIS DIPSTICK
Bilirubin, UA: NEGATIVE
GLUCOSE UA: NEGATIVE
KETONES UA: NEGATIVE
Leukocytes, UA: NEGATIVE
Nitrite, UA: NEGATIVE
Protein, UA: NEGATIVE
RBC UA: NEGATIVE
Urobilinogen, UA: NEGATIVE
pH, UA: 7

## 2013-07-02 NOTE — Progress Notes (Signed)
   Subjective:    Patient ID: Adam Rosales, male    DOB: 04-25-1951, 63 y.o.   MRN: [id number]  HPI He is here for complete examination. He was seen March 1 and evaluated for a presyncopal episode. That ER record was reviewed. Since then he has noted a slight dizziness sensation but no chest pain, DOE, PND, irregular heartbeat. Past medical history significant for hyperlipidemia. He does not smoke. There is no family history of heart disease. He is taking Lipitor   Review of Systems     Objective:   Physical Exam alert and in no distress. Tympanic membranes and canals are normal. Throat is clear. Tonsils are normal. Neck is supple without adenopathy or thyromegaly. Cardiac exam shows an irreglar rhythm without murmurs or gallops. Lungs are clear to auscultation. EKG shows PVCs but no acute changes. It was compared to the ER EKG. Blood pressure is recorded. Blood work from the emergency room was reviewed       Assessment & Plan:  Routine general medical examination at a health care facility - Plan: Urinalysis Dipstick  Hypotension, unspecified - Plan: EKG 12-Lead  PVC's (premature ventricular contractions) - Plan: EKG 12-Lead  refer to cardiology. I deferred the rest of the physical exam until later date. He was comfortable with that.

## 2013-07-03 ENCOUNTER — Encounter: Payer: Self-pay | Admitting: Cardiology

## 2013-07-03 NOTE — Progress Notes (Unsigned)
Patient ID: Adam Rosales, male   DOB: 04/18/1951, 63 y.o.   MRN: [id number]   Adam, Rosales    Date of visit:  07/03/2013 DOB:  03/24/1951    Age:  63 yrs. Medical record number: [id number]    Account number:  [id number] Primary Care Provider: Jill Alexanders C ____________________________ CURRENT DIAGNOSES  1. Syncope  2. Hyperlipidemia  3. Obesity(BMI30-40)  4. Arrhythmia-PVC's(asymptomatic) ____________________________ ALLERGIES  No Known Allergies ____________________________ MEDICATIONS  1. aspirin 81 mg chewable tablet, 1 p.o. daily  2. atorvastatin 20 mg tablet, 1 p.o. daily  3. Fish Oil 500 mg capsule, 1000mg  qd  4. Ocuvite 150 mg-30 unit-5 mg-150 mg capsule, 1 p.o. 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 The patient has been in good health except for significant situational stress both at home and at work, hyperlipidemia, and obesity. He had been getting along fairly well but while sitting in church he felt sweaty and somewhat lightheaded and was taken out of there by wheelchair and sent to the emergency room. He was found to have somewhat low blood pressure in the emergency room and was given intravenous fluids. EKG and lab work was fine and he later followed up with his primary physician who asked me to see him. PVCs were documented on EKG. The patient does not exercise and denies palpitations and is unaware of the premature beats that he has. He has no PND, orthopnea, syncope, palpitations, or claudication. He doesn't use a lot of caffeine. He does not use any significant herbal products. ____________________________ PAST HISTORY  Past Medical Illnesses:  hyperlipidemia, hypotension;  Cardiovascular Illnesses:  no previous history of cardiac disease.;  Surgical Procedures:  inguinal herniorrhaphy-left;  Cardiology  Procedures-Invasive:  no history of prior cardiac procedures;  Cardiology Procedures-Noninvasive:  no previous non-invasive procedures;  LVEF not documented,   ____________________________ CARDIO-PULMONARY TEST DATES EKG Date:  07/03/2013;   ____________________________ FAMILY HISTORY Brother -- Brother alive and well Brother -- Brother alive with problem, Hypertension Brother -- Brother alive with problem, Prostate cancer Brother -- Brother alive and well Father -- Coronary artery bypass grafting, Father dead, Myocardial infarction, Congestive heart failure Mother -- Mother alive and well ____________________________ SOCIAL HISTORY Alcohol Use:  wine and 2 x week;  Smoking:  never smoked;  Diet:  regular diet;  Lifestyle:  married;  Exercise:  no regular exercise;  Occupation:  Licensed conveyancer professor at [employer];  Residence:  lives with wife and children;   ____________________________ REVIEW OF SYSTEMS General:  obesity  Integumentary:dry skin Eyes: wears eye glasses/contact lenses Ears, Nose, Throat, Mouth:  denies any hearing loss, epistaxis, hoarseness or difficulty speaking. Respiratory: denies dyspnea, cough, wheezing or hemoptysis. Cardiovascular:  please review HPI Abdominal: denies dyspepsia, GI bleeding, constipation, or diarrhea Genitourinary-Male: frequency  Musculoskeletal:  denies arthritis, venous insufficiency, or muscle weakness Neurological:  denies headaches, stroke, or TIA Psychiatric:  situational stress  ____________________________ PHYSICAL EXAMINATION VITAL SIGNS  Blood Pressure:  110/64 Sitting, Left arm, regular cuff  , 110/60 Standing, Left arm and regular cuff   Pulse:  90/min. Weight:  246.00 lbs. Height:  71"BMI: 34  Constitutional:  pleasant white male in no acute distress, moderately obese Skin:  warm and dry to touch, no apparent skin lesions, or masses noted. Head:  normocephalic, normal hair pattern, no masses or tenderness Eyes:  EOMS Intact, PERRLA, C and S  clear, Funduscopic exam not done. ENT:  ears, nose  and throat reveal no gross abnormalities.  Dentition good. Neck:  supple, without massess. No JVD, thyromegaly or carotid bruits. Carotid upstroke normal. Chest:  normal symmetry, clear to auscultation. Cardiac:  regular rhythm, normal S1 and S2, No S3 or S4, no murmurs, gallops or rubs detected. Abdomen:  abdomen soft,non-tender, no masses, no hepatospenomegaly, or aneurysm noted, mildly obese Peripheral Pulses:  the femoral,dorsalis pedis, and posterior tibial pulses are full and equal bilaterally with no bruits auscultated. Extremities & Back:  no deformities, clubbing, cyanosis, erythema or edema observed. Normal muscle strength and tone. Neurological:  no gross motor or sensory deficits noted, affect appropriate, oriented x3. ____________________________ IMPRESSIONS/PLAN  1. Episodic presyncope and lightheadedness that could represent a vagal reaction 2. PVCs-asymptomatic 3. Obesity 4. Hyperlipidemia under treatment  Recommendations:  My gut feeling is that he likely has nothing serious going on and he does admit to a lot of situational stress with the daughter as well as work pressures. He has not been exercising. EKG is normal except for PVC's.  My recommendations are as follows:  1. Obtain echocardiogram and have him wear a cardiac event monitor to be sure he does not have significant cardiac arrhythmias or LV dysfunction to cause syncope 2. Exercise treadmill test evaluate exercise tolerance. 3. He will eventually need to follow a carbohydrate restricted diet and try to lose down to an ideal body weight  Thank you for asking me to see him with you. ____________________________ TODAYS ORDERS  1. [place]: Today  2. 2D, color flow, doppler: First Available  3. treadmill:  Regular TM At [facility]  4. 12 Lead EKG: Today                       ____________________________ Cardiology Physician:  Kerry Hough MD [facility]

## 2013-07-25 ENCOUNTER — Encounter: Payer: Self-pay | Admitting: Cardiology

## 2013-07-25 NOTE — Progress Notes (Unsigned)
Patient ID: Adam Rosales, male   DOB: October 23, 1950, 63 y.o.   MRN: [id number]   Adam Rosales, Adam Rosales    Date of visit:  07/25/2013 DOB:  1951/02/16    Age:  48 yrs. Medical record number:  [id number]     Account number:  [id number] Primary Care Provider: Jill Alexanders C  CURRENT DIAGNOSES  1. Syncope  2. Hyperlipidemia  3. Obesity(BMI30-40)  4. Arrhythmia-PVC's(asymptomatic)  TREADMILL  The patient exercised on the standard Bruce protocol a total of 10 minutes into Bruce stage 4 achieving a workload of 11 METS. The test was stopped due to dyspnea and fatigue. The patient had no chest pain suggestive of angina. Heart rate rose to 164 which was 104% of predicted maximum. Blood pressure response was normal.  12-lead EKG is normal at rest with occasional PVC's. With exercise the patient achieved target heart rate and had no ST segment depression consistent with ischemia. PVC's were present at rest and were suppressed with exercise.  IMPRESSIONS:  1. Clinically and EKG negative for ischemia 2. PVC's occuring at rest and suppressed with exercise   RECOMMENDATIONS:  The patient has a negative adequate exercise treadmill test with no evidence of myocardial ischemia. Results of recent testing is as follows:  A. Echocardiogram done today shows mild concentric LVH with evidence of grade 1 diastolic dysfunction. B. Event monitor worn to date shows premature ventricular contractions that are asymptomatic. There was no evidence of bradycardia C. treadmill today was completely normal.  At this point he has no evidence of myocardial ischemia and has mild LVH that may be due to his obesity. His PVCs are asymptomatic and needed no more treatment. His recent episode of syncope may have been due to mild dehydration or could have been vasovagal.  My recommendations are as follows:  1.  Follow a carbohydrate restricted diet such as the [employer] and try to lose down to an ideal body weight over the next  year or so. 2. No treatment is necessary for the PVCs at this time 3. Remain well hydrated including drinking a couple of glasses of liquids first thing in the morning to prevent dehydration as his blood pressure runs somewhat low.  Thank you for asking me to see him with you.   MOST RECENT LIPID PANEL 07/25/13  CHOL TOTL 128 mg/dl, LDL 79 NM, HDL 32 mg/dl, TRIGLYCER 88 mg/dl and CHOL/HDL 4.0 (Calc)  Cardiology Physician:  Kerry Hough. MD [facility]

## 2013-08-01 ENCOUNTER — Encounter: Payer: Self-pay | Admitting: Family Medicine

## 2013-08-21 ENCOUNTER — Encounter: Payer: Self-pay | Admitting: Family Medicine

## 2013-09-07 ENCOUNTER — Ambulatory Visit: Payer: BC Managed Care – PPO

## 2013-09-07 ENCOUNTER — Ambulatory Visit (INDEPENDENT_AMBULATORY_CARE_PROVIDER_SITE_OTHER): Payer: BC Managed Care – PPO | Admitting: Internal Medicine

## 2013-09-07 VITALS — BP 120/78 | HR 81 | Temp 98.0°F | Resp 18 | Ht 71.0 in | Wt 250.0 lb

## 2013-09-07 DIAGNOSIS — S63599A Other specified sprain of unspecified wrist, initial encounter: Secondary | ICD-10-CM

## 2013-09-07 DIAGNOSIS — M25539 Pain in unspecified wrist: Secondary | ICD-10-CM

## 2013-09-07 DIAGNOSIS — S66819A Strain of other specified muscles, fascia and tendons at wrist and hand level, unspecified hand, initial encounter: Secondary | ICD-10-CM

## 2013-09-07 DIAGNOSIS — M25532 Pain in left wrist: Secondary | ICD-10-CM

## 2013-09-07 NOTE — Patient Instructions (Signed)
Wrist Sprain °with Rehab °A sprain is an injury in which a ligament that maintains the proper alignment of a joint is partially or completely torn. The ligaments of the wrist are susceptible to sprains. Sprains are classified into three categories. Grade 1 sprains cause pain, but the tendon is not lengthened. Grade 2 sprains include a lengthened ligament because the ligament is stretched or partially ruptured. With grade 2 sprains there is still function, although the function may be diminished. Grade 3 sprains are characterized by a complete tear of the tendon or muscle, and function is usually impaired. °SYMPTOMS  °· Pain tenderness, inflammation, and/or bruising (contusion) of the injury. °· A "pop" or tear felt and/or heard at the time of injury. °· Decreased wrist function. °CAUSES  °A wrist sprain occurs when a force is placed on one or more ligaments that is greater than it/they can withstand. Common mechanisms of injury include: °· Catching a ball with you hands. °· Repetitive and/ or strenuous extension or flexion of the wrist. °RISK INCREASES WITH: °· Previous wrist injury. °· Contact sports (boxing or wrestling). °· Activities in which falling is common. °· Poor strength and flexibility. °· Improperly fitted or padded protective equipment. °PREVENTION °· Warm up and stretch properly before activity. °· Allow for adequate recovery between workouts. °· Maintain physical fitness: °· Strength, flexibility, and endurance. °· Cardiovascular fitness. °· Protect the wrist joint by limiting its motion with the use of taping, braces, or splints. °· Protect the wrist after injury for 6 to 12 months. °PROGNOSIS  °The prognosis for wrist sprains depends on the degree of injury. Grade 1 sprains require 2 to 6 weeks of treatment. Grade 2 sprains require 6 to 8 weeks of treatment, and grade 3 sprains require up to 12 weeks.  °RELATED COMPLICATIONS  °· Prolonged healing time, if improperly treated or  re-injured. °· Recurrent symptoms that result in a chronic problem. °· Injury to nearby structures (bone, cartilage, nerves, or tendons). °· Arthritis of the wrist. °· Inability to compete in athletics at a high level. °· Wrist stiffness or weakness. °· Progression to a complete rupture of the ligament. °TREATMENT  °Treatment initially involves resting from any activities that aggravate the symptoms, and the use of ice and medications to help reduce pain and inflammation. Your caregiver may recommend immobilizing the wrist for a period of time in order to reduce stress on the ligament and allow for healing. After immobilization it is important to perform strengthening and stretching exercises to help regain strength and a full range of motion. These exercises may be completed at home or with a therapist. Surgery is not usually required for wrist sprains, unless the ligament has been ruptured (grade 3 sprain). °MEDICATION  °· If pain medication is necessary, then nonsteroidal anti-inflammatory medications, such as aspirin and ibuprofen, or other minor pain relievers, such as acetaminophen, are often recommended. °· Do not take pain medication for 7 days before surgery. °· Prescription pain relievers may be given if deemed necessary by your caregiver. Use only as directed and only as much as you need. °HEAT AND COLD °· Cold treatment (icing) relieves pain and reduces inflammation. Cold treatment should be applied for 10 to 15 minutes every 2 to 3 hours for inflammation and pain and immediately after any activity that aggravates your symptoms. Use ice packs or massage the area with a piece of ice (ice massage). °· Heat treatment may be used prior to performing the stretching and strengthening activities prescribed by your   caregiver, physical therapist, or athletic trainer. Use a heat pack or soak your injury in warm water. °SEEK MEDICAL CARE IF: °· Treatment seems to offer no benefit, or the condition worsens. °· Any  medications produce adverse side effects. °EXERCISES °RANGE OF MOTION (ROM) AND STRETCHING EXERCISES - Wrist Sprain  °These exercises may help you when beginning to rehabilitate your injury. Your symptoms may resolve with or without further involvement from your physician, physical therapist or athletic trainer. While completing these exercises, remember:  °· Restoring tissue flexibility helps normal motion to return to the joints. This allows healthier, less painful movement and activity. °· An effective stretch should be held for at least 30 seconds. °· A stretch should never be painful. You should only feel a gentle lengthening or release in the stretched tissue. °RANGE OF MOTION  Wrist Flexion, Active-Assisted °· Extend your right / left elbow with your fingers pointing down.* °· Gently pull the back of your hand towards you until you feel a gentle stretch on the top of your forearm. °· Hold this position for __________ seconds. °Repeat __________ times. Complete this exercise __________ times per day.  °*If directed by your physician, physical therapist or athletic trainer, complete this stretch with your elbow bent rather than extended. °RANGE OF MOTION  Wrist Extension, Active-Assisted °· Extend your right / left elbow and turn your palm upwards.* °· Gently pull your palm/fingertips back so your wrist extends and your fingers point more toward the ground. °· You should feel a gentle stretch on the inside of your forearm. °· Hold this position for __________ seconds. °Repeat __________ times. Complete this exercise __________ times per day. °*If directed by your physician, physical therapist or athletic trainer, complete this stretch with your elbow bent, rather than extended. °RANGE OF MOTION  Supination, Active °· Stand or sit with your elbows at your side. Bend your right / left elbow to 90 degrees. °· Turn your palm upward until you feel a gentle stretch on the inside of your forearm. °· Hold this position  for __________ seconds. Slowly release and return to the starting position. °Repeat __________ times. Complete this stretch __________ times per day.  °RANGE OF MOTION  Pronation, Active °· Stand or sit with your elbows at your side. Bend your right / left elbow to 90 degrees. °· Turn your palm downward until you feel a gentle stretch on the top of your forearm. °· Hold this position for __________ seconds. Slowly release and return to the starting position. °Repeat __________ times. Complete this stretch __________ times per day.  °STRETCH - Wrist Flexion °· Place the back of your right / left hand on a tabletop leaving your elbow slightly bent. Your fingers should point away from your body. °· Gently press the back of your hand down onto the table by straightening your elbow. You should feel a stretch on the top of your forearm. °· Hold this position for __________ seconds. °Repeat __________ times. Complete this stretch __________ times per day.  °STRETCH  Wrist Extension °· Place your right / left fingertips on a tabletop leaving your elbow slightly bent. Your fingers should point backwards. °· Gently press your fingers and palm down onto the table by straightening your elbow. You should feel a stretch on the inside of your forearm. °· Hold this position for __________ seconds. °Repeat __________ times. Complete this stretch __________ times per day.  °STRENGTHENING EXERCISES - Wrist Sprain °These exercises may help you when beginning to rehabilitate your injury.   They may resolve your symptoms with or without further involvement from your physician, physical therapist or athletic trainer. While completing these exercises, remember:  °· Muscles can gain both the endurance and the strength needed for everyday activities through controlled exercises. °· Complete these exercises as instructed by your physician, physical therapist or athletic trainer. Progress with the resistance and repetition exercises only as your  caregiver advises. °STRENGTH Wrist Flexors °· Sit with your right / left forearm palm-up and fully supported. Your elbow should be resting below the height of your shoulder. Allow your wrist to extend over the edge of the surface. °· Loosely holding a __________ weight or a piece of rubber exercise band/tubing, slowly curl your hand up toward your forearm. °· Hold this position for __________ seconds. Slowly lower the wrist back to the starting position in a controlled manner. °Repeat __________ times. Complete this exercise __________ times per day.  °STRENGTH  Wrist Extensors °· Sit with your right / left forearm palm-down and fully supported. Your elbow should be resting below the height of your shoulder. Allow your wrist to extend over the edge of the surface. °· Loosely holding a __________ weight or a piece of rubber exercise band/tubing, slowly curl your hand up toward your forearm. °· Hold this position for __________ seconds. Slowly lower the wrist back to the starting position in a controlled manner. °Repeat __________ times. Complete this exercise __________ times per day.  °STRENGTH - Ulnar Deviators °· Stand with a ____________________ weight in your right / left hand, or sit holding on to the rubber exercise band/tubing with your opposite arm supported. °· Move your wrist so that your pinkie travels toward your forearm and your thumb moves away from your forearm. °· Hold this position for __________ seconds and then slowly lower the wrist back to the starting position. °Repeat __________ times. Complete this exercise __________ times per day °STRENGTH - Radial Deviators °· Stand with a ____________________ weight in your °· right / left hand, or sit holding on to the rubber exercise band/tubing with your arm supported. °· Raise your hand upward in front of you or pull up on the rubber tubing. °· Hold this position for __________ seconds and then slowly lower the wrist back to the starting  position. °Repeat __________ times. Complete this exercise __________ times per day. °STRENGTH  Forearm Supinators °· Sit with your right / left forearm supported on a table, keeping your elbow below shoulder height. Rest your hand over the edge, palm down. °· Gently grip a hammer or a soup ladle. °· Without moving your elbow, slowly turn your palm and hand upward to a "thumbs-up" position. °· Hold this position for __________ seconds. Slowly return to the starting position. °Repeat __________ times. Complete this exercise __________ times per day.  °STRENGTH  Forearm Pronators °· Sit with your right / left forearm supported on a table, keeping your elbow below shoulder height. Rest your hand over the edge, palm up. °· Gently grip a hammer or a soup ladle. °· Without moving your elbow, slowly turn your palm and hand upward to a "thumbs-up" position. °· Hold this position for __________ seconds. Slowly return to the starting position. °Repeat __________ times. Complete this exercise __________ times per day.  °STRENGTH - Grip °· Grasp a tennis ball, a dense sponge, or a large, rolled sock in your hand. °· Squeeze as hard as you can without increasing any pain. °· Hold this position for __________ seconds. Release your grip slowly. °Repeat   __________ times. Complete this exercise __________ times per day.  °Document Released: 04/12/2005 Document Revised: 07/05/2011 Document Reviewed: 07/25/2008 °[employer]® Patient Information ©2014 [employer]. ° °

## 2013-09-07 NOTE — Progress Notes (Signed)
   Subjective:    Patient ID: Adam Rosales, male    DOB: 1950/12/05, 63 y.o.   MRN: [id number]  HPI 63 year old pt here for injury to left wrist. He fell late last night getting out of the shower. He tried to brace with his left wrist and landed on it. No other complains of pain besides his wrist. No OTC medications for pain. Minor swelling. Pain is more towards ulnar side. He has previously broken the ulna and the radius back in 4th grade; no surgery.    Review of Systems     Objective:   Physical Exam Left wrist swollen, painful to move NMVS intact Most tender radial wris  [facility] reading (PRIMARY) by  Dr.Guest no fx seen       Assessment & Plan:  Sprain wrist left/ro occult fx Splint/RICE RTC or see FP if not 100% well 1-2weeks

## 2013-11-12 ENCOUNTER — Other Ambulatory Visit: Payer: Self-pay

## 2013-11-12 ENCOUNTER — Telehealth: Payer: Self-pay | Admitting: Internal Medicine

## 2013-11-12 MED ORDER — ATORVASTATIN CALCIUM 20 MG PO TABS: 20.0000 mg | ORAL_TABLET | Freq: Every day | ORAL | Status: DC

## 2013-11-12 NOTE — Telephone Encounter (Signed)
done

## 2013-11-12 NOTE — Telephone Encounter (Signed)
Sent in [facility] per pt request

## 2013-11-12 NOTE — Telephone Encounter (Signed)
Refill request for atorvastatin 20mg  #30 to [facility] [address]

## 2013-12-04 ENCOUNTER — Ambulatory Visit (INDEPENDENT_AMBULATORY_CARE_PROVIDER_SITE_OTHER): Payer: BC Managed Care – PPO | Admitting: Family Medicine

## 2013-12-04 ENCOUNTER — Encounter: Payer: Self-pay | Admitting: Family Medicine

## 2013-12-04 VITALS — BP 140/100 | HR 70 | Ht 70.0 in | Wt 249.0 lb

## 2013-12-04 DIAGNOSIS — Z8042 Family history of malignant neoplasm of prostate: Secondary | ICD-10-CM

## 2013-12-04 DIAGNOSIS — Z569 Unspecified problems related to employment: Secondary | ICD-10-CM

## 2013-12-04 DIAGNOSIS — Z6379 Other stressful life events affecting family and household: Secondary | ICD-10-CM

## 2013-12-04 DIAGNOSIS — E785 Hyperlipidemia, unspecified: Secondary | ICD-10-CM

## 2013-12-04 DIAGNOSIS — E669 Obesity, unspecified: Secondary | ICD-10-CM

## 2013-12-04 DIAGNOSIS — Z566 Other physical and mental strain related to work: Secondary | ICD-10-CM

## 2013-12-04 NOTE — Progress Notes (Signed)
   Subjective:    Patient ID: Adam Rosales, male    DOB: 1950/12/13, 63 y.o.   MRN: [id number]  HPI He is here for consult and followup on his last exam. He was evaluated by cardiology and is doing quite nicely at this time. He does have underlying hyperlipidemia as well as a family history of prostate cancer. His main issue is stress and he is being placed under. He is an 33 year old daughter who apparently has major psychological issues including suicidal ideation. She is being followed by psychiatry. This has been going on for several years. Apparently she is getting ready to go to college to take a few courses. Also his wife had breast cancer and in the last year has been doing quite nicely. His work keeps him quite stressed. 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 He has had difficulty with sleep because of this. He also has been stress eating.   Review of Systems     Objective:   Physical Exam  Alert and in no distress with appropriate affect       Assessment & Plan:  Stress due to illness of family member  Stress at work  Obesity (BMI 30-39.9)  Hyperlipidemia with target LDL less than 100  Family history of prostate cancer  Approximately 30 minutes spent discussing all these issues with him. Encouraged him to get involved in counseling to help both he and his wife deal with the daughter. Also encouraged him to take time out of his wife or himself. He plans to do this by walking the dogs. Discussed his weight and at this point, no major intervention will be considered.

## 2013-12-04 NOTE — Patient Instructions (Addendum)
Insomnia Insomnia is frequent trouble falling and/or staying asleep. Insomnia can be a long term problem or a short term problem. Both are common. Insomnia can be a short term problem when the wakefulness is related to a certain stress or worry. Long term insomnia is often related to ongoing stress during waking hours and/or poor sleeping habits. Overtime, sleep deprivation itself can make the problem worse. Every little thing feels more severe because you are overtired and your ability to cope is decreased. CAUSES   Stress, anxiety, and depression.  Poor sleeping habits.  Distractions such as TV in the bedroom.  Naps close to bedtime.  Engaging in emotionally charged conversations before bed.  Technical reading before sleep.  Alcohol and other sedatives. They may make the problem worse. They can hurt normal sleep patterns and normal dream activity.  Stimulants such as caffeine for several hours prior to bedtime.  Pain syndromes and shortness of breath can cause insomnia.  Exercise late at night.  Changing time zones may cause sleeping problems (jet lag). It is sometimes helpful to have someone observe your sleeping patterns. They should look for periods of not breathing during the night (sleep apnea). They should also look to see how long those periods last. If you live alone or observers are uncertain, you can also be observed at a sleep clinic where your sleep patterns will be professionally monitored. Sleep apnea requires a checkup and treatment. Give your caregivers your medical history. Give your caregivers observations your family has made about your sleep.  SYMPTOMS   Not feeling rested in the morning.  Anxiety and restlessness at bedtime.  Difficulty falling and staying asleep. TREATMENT   Your caregiver may prescribe treatment for an underlying medical disorders. Your caregiver can give advice or help if you are using alcohol or other drugs for self-medication. Treatment  of underlying problems will usually eliminate insomnia problems.  Medications can be prescribed for short time use. They are generally not recommended for lengthy use.  Over-the-counter sleep medicines are not recommended for lengthy use. They can be habit forming.  You can promote easier sleeping by making lifestyle changes such as:  Using relaxation techniques that help with breathing and reduce muscle tension.  Exercising earlier in the day.  Changing your diet and the time of your last meal. No night time snacks.  Establish a regular time to go to bed.  Counseling can help with stressful problems and worry.  Soothing music and white noise may be helpful if there are background noises you cannot remove.  Stop tedious detailed work at least one hour before bedtime. HOME CARE INSTRUCTIONS   Keep a diary. Inform your caregiver about your progress. This includes any medication side effects. See your caregiver regularly. Take note of:  Times when you are asleep.  Times when you are awake during the night.  The quality of your sleep.  How you feel the next day. This information will help your caregiver care for you.  Get out of bed if you are still awake after 15 minutes. Read or do some quiet activity. Keep the lights down. Wait until you feel sleepy and go back to bed.  Keep regular sleeping and waking hours. Avoid naps.  Exercise regularly.  Avoid distractions at bedtime. Distractions include watching television or engaging in any intense or detailed activity like attempting to balance the household checkbook.  Develop a bedtime ritual. Keep a familiar routine of bathing, brushing your teeth, climbing into bed at the same   time each night, listening to soothing music. Routines increase the success of falling to sleep faster.  Use relaxation techniques. This can be using breathing and muscle tension release routines. It can also include visualizing peaceful scenes. You can  also help control troubling or intruding thoughts by keeping your mind occupied with boring or repetitive thoughts like the old concept of counting sheep. You can make it more creative like imagining planting one beautiful flower after another in your backyard garden.  During your day, work to eliminate stress. When this is not possible use some of the previous suggestions to help reduce the anxiety that accompanies stressful situations. MAKE SURE YOU:   Understand these instructions.  Will watch your condition.  Will get help right away if you are not doing well or get worse. Document Released: 04/09/2000 Document Revised: 07/05/2011 Document Reviewed: 05/10/2007 [facility] Patient Information 2015 [place], [place]. This information is not intended to replace advice given to you by your health care provider. Make sure you discuss any questions you have with your health care provider. Use [employer]

## 2014-02-10 ENCOUNTER — Other Ambulatory Visit: Payer: Self-pay | Admitting: Family Medicine

## 2014-03-11 ENCOUNTER — Other Ambulatory Visit: Payer: Self-pay | Admitting: Family Medicine

## 2014-04-15 ENCOUNTER — Telehealth: Payer: Self-pay | Admitting: Family Medicine

## 2014-04-15 ENCOUNTER — Other Ambulatory Visit: Payer: Self-pay | Admitting: Family Medicine

## 2014-04-15 MED ORDER — ATORVASTATIN CALCIUM 20 MG PO TABS: 20.0000 mg | ORAL_TABLET | Freq: Every day | ORAL | Status: DC

## 2014-04-24 NOTE — Telephone Encounter (Signed)
lm

## 2014-08-17 ENCOUNTER — Other Ambulatory Visit: Payer: Self-pay | Admitting: Family Medicine

## 2014-09-16 ENCOUNTER — Other Ambulatory Visit: Payer: Self-pay | Admitting: Family Medicine

## 2014-10-16 ENCOUNTER — Other Ambulatory Visit: Payer: Self-pay | Admitting: Family Medicine

## 2014-11-05 ENCOUNTER — Encounter: Payer: Self-pay | Admitting: Family Medicine

## 2014-11-05 ENCOUNTER — Ambulatory Visit (INDEPENDENT_AMBULATORY_CARE_PROVIDER_SITE_OTHER): Payer: BC Managed Care – PPO | Admitting: Family Medicine

## 2014-11-05 VITALS — BP 140/92 | HR 82 | Temp 98.2°F | Wt 242.0 lb

## 2014-11-05 DIAGNOSIS — J0100 Acute maxillary sinusitis, unspecified: Secondary | ICD-10-CM

## 2014-11-05 DIAGNOSIS — E785 Hyperlipidemia, unspecified: Secondary | ICD-10-CM

## 2014-11-05 DIAGNOSIS — Z79899 Other long term (current) drug therapy: Secondary | ICD-10-CM

## 2014-11-05 DIAGNOSIS — E669 Obesity, unspecified: Secondary | ICD-10-CM | POA: Diagnosis not present

## 2014-11-05 DIAGNOSIS — J208 Acute bronchitis due to other specified organisms: Secondary | ICD-10-CM

## 2014-11-05 LAB — CBC WITH DIFFERENTIAL/PLATELET
Basophils Absolute: 0 10*3/uL (ref 0.0–0.1)
Basophils Relative: 0 % (ref 0–1)
EOS ABS: 0.5 10*3/uL (ref 0.0–0.7)
EOS PCT: 5 % (ref 0–5)
HEMATOCRIT: 47.4 % (ref 39.0–52.0)
HEMOGLOBIN: 16.1 g/dL (ref 13.0–17.0)
LYMPHS ABS: 1.6 10*3/uL (ref 0.7–4.0)
Lymphocytes Relative: 17 % (ref 12–46)
MCH: 31.4 pg (ref 26.0–34.0)
MCHC: 34 g/dL (ref 30.0–36.0)
MCV: 92.6 fL (ref 78.0–100.0)
MPV: 9.7 fL (ref 8.6–12.4)
Monocytes Absolute: 1 10*3/uL (ref 0.1–1.0)
Monocytes Relative: 11 % (ref 3–12)
Neutro Abs: 6.3 10*3/uL (ref 1.7–7.7)
Neutrophils Relative %: 67 % (ref 43–77)
Platelets: 270 10*3/uL (ref 150–400)
RBC: 5.12 MIL/uL (ref 4.22–5.81)
RDW: 13.5 % (ref 11.5–15.5)
WBC: 9.4 10*3/uL (ref 4.0–10.5)

## 2014-11-05 MED ORDER — ATORVASTATIN CALCIUM 20 MG PO TABS: 20.0000 mg | ORAL_TABLET | Freq: Every day | ORAL | Status: DC

## 2014-11-05 MED ORDER — AMOXICILLIN 875 MG PO TABS: 875.0000 mg | ORAL_TABLET | Freq: Two times a day (BID) | ORAL | Status: DC

## 2014-11-05 NOTE — Progress Notes (Signed)
   Subjective:    Patient ID: Adam Rosales, male    DOB: March 30, 1951, 64 y.o.   MRN: [id number]  HPI Approximately 12 days ago he started having difficulty with nasal congestion, rhinorrhea and PND. He then developed a dry cough that has become productive. He's had no fever, chills, earache. He does not smoke. He was recently on a trip to [place]. He has lost several pounds and feels good about this. He also is on Lipitor but has not had blood work in quite some time.   Review of Systems     Objective:   Physical Exam Alert and in no distress. Tympanic membranes and canals are normal. Pharyngeal area is normal. Neck is supple without adenopathy or thyromegaly. Cardiac exam shows a regular sinus rhythm without murmurs or gallops. Lungs Show rhonchi especially on the right. Tender to palpation over maxillary and frontal sinuses.        Assessment & Plan:  Acute bronchitis due to other specified organisms  Acute maxillary sinusitis, recurrence not specified - Plan: amoxicillin (AMOXIL) 875 MG tablet  Hyperlipidemia with target LDL less than 100 - Plan: CBC with Differential/Platelet, Comprehensive metabolic panel, Lipid panel, atorvastatin (LIPITOR) 20 MG tablet  Obesity (BMI 30-39.9) - Plan: CBC with Differential/Platelet, Comprehensive metabolic panel, Lipid panel  Encounter for long-term (current) use of medications - Plan: CBC with Differential/Platelet, Comprehensive metabolic panel, Lipid panel He is to call me if not entirely better when he finishes the antibody.

## 2014-11-05 NOTE — Patient Instructions (Signed)
Call if not entirely better when you finish the antibiotic 

## 2014-11-06 LAB — COMPREHENSIVE METABOLIC PANEL
ALK PHOS: 54 U/L (ref 39–117)
ALT: 25 U/L (ref 0–53)
AST: 24 U/L (ref 0–37)
Albumin: 4.1 g/dL (ref 3.5–5.2)
BILIRUBIN TOTAL: 0.8 mg/dL (ref 0.2–1.2)
BUN: 12 mg/dL (ref 6–23)
CHLORIDE: 106 meq/L (ref 96–112)
CO2: 24 mEq/L (ref 19–32)
CREATININE: 0.7 mg/dL (ref 0.50–1.35)
Calcium: 8.7 mg/dL (ref 8.4–10.5)
GLUCOSE: 87 mg/dL (ref 70–99)
POTASSIUM: 3.9 meq/L (ref 3.5–5.3)
SODIUM: 142 meq/L (ref 135–145)
Total Protein: 6.2 g/dL (ref 6.0–8.3)

## 2014-11-06 LAB — LIPID PANEL
CHOL/HDL RATIO: 3.5 ratio
Cholesterol: 112 mg/dL (ref 0–200)
HDL: 32 mg/dL — ABNORMAL LOW (ref >=40)
LDL Cholesterol: 60 mg/dL (ref 0–99)
TRIGLYCERIDES: 101 mg/dL (ref <150)
VLDL: 20 mg/dL (ref 0–40)

## 2014-11-11 ENCOUNTER — Telehealth: Payer: Self-pay | Admitting: Family Medicine

## 2014-11-11 MED ORDER — LEVOFLOXACIN 500 MG PO TABS: 500.0000 mg | ORAL_TABLET | Freq: Every day | ORAL | Status: DC

## 2014-11-11 NOTE — Telephone Encounter (Signed)
Pt states on day 6 of antibiotic and states you had told him to call if he wasn't better and He is no better at all.  Wants to know what to do next?

## 2014-11-11 NOTE — Telephone Encounter (Signed)
He is no better with Amoxil and I will therefore switch him to Levaquin

## 2014-11-11 NOTE — Telephone Encounter (Signed)
Pt.notified

## 2014-11-11 NOTE — Telephone Encounter (Signed)
Let him know that I switched him to a different antibiotically and let us know when he finishes that one if he is not totally back to normal

## 2014-11-20 ENCOUNTER — Telehealth: Payer: Self-pay | Admitting: Family Medicine

## 2014-11-20 MED ORDER — LEVOFLOXACIN 500 MG PO TABS: 500.0000 mg | ORAL_TABLET | Freq: Every day | ORAL | Status: DC

## 2014-11-20 NOTE — Telephone Encounter (Signed)
Pt states has felt better the last 2 days but still not completely better, wants another round of antibiotics called in

## 2015-11-13 ENCOUNTER — Other Ambulatory Visit: Payer: Self-pay | Admitting: Family Medicine

## 2016-02-23 ENCOUNTER — Ambulatory Visit (INDEPENDENT_AMBULATORY_CARE_PROVIDER_SITE_OTHER): Payer: BC Managed Care – PPO | Admitting: Family Medicine

## 2016-02-23 ENCOUNTER — Encounter: Payer: Self-pay | Admitting: Family Medicine

## 2016-02-23 VITALS — BP 134/90 | HR 86 | Ht 71.0 in | Wt 236.0 lb

## 2016-02-23 DIAGNOSIS — Z0000 Encounter for general adult medical examination without abnormal findings: Secondary | ICD-10-CM | POA: Diagnosis not present

## 2016-02-23 DIAGNOSIS — E785 Hyperlipidemia, unspecified: Secondary | ICD-10-CM

## 2016-02-23 DIAGNOSIS — Z79899 Other long term (current) drug therapy: Secondary | ICD-10-CM

## 2016-02-23 DIAGNOSIS — E669 Obesity, unspecified: Secondary | ICD-10-CM | POA: Diagnosis not present

## 2016-02-23 DIAGNOSIS — Z8042 Family history of malignant neoplasm of prostate: Secondary | ICD-10-CM

## 2016-02-23 LAB — CBC WITH DIFFERENTIAL/PLATELET
BASOS ABS: 64 {cells}/uL (ref 0–200)
BASOS PCT: 1 %
EOS ABS: 256 {cells}/uL (ref 15–500)
Eosinophils Relative: 4 %
HEMATOCRIT: 47.3 % (ref 38.5–50.0)
Hemoglobin: 16.6 g/dL (ref 13.2–17.1)
LYMPHS PCT: 39 %
Lymphs Abs: 2496 cells/uL (ref 850–3900)
MCH: 32.2 pg (ref 27.0–33.0)
MCHC: 35.1 g/dL (ref 32.0–36.0)
MCV: 91.8 fL (ref 80.0–100.0)
MONO ABS: 512 {cells}/uL (ref 200–950)
MPV: 9.6 fL (ref 7.5–12.5)
Monocytes Relative: 8 %
Neutro Abs: 3072 cells/uL (ref 1500–7800)
Neutrophils Relative %: 48 %
Platelets: 232 10*3/uL (ref 140–400)
RBC: 5.15 MIL/uL (ref 4.20–5.80)
RDW: 13.5 % (ref 11.0–15.0)
WBC: 6.4 10*3/uL (ref 4.0–10.5)

## 2016-02-23 LAB — LIPID PANEL
CHOL/HDL RATIO: 3.5 ratio (ref <=5.0)
Cholesterol: 136 mg/dL (ref 125–200)
HDL: 39 mg/dL — AB (ref >=40)
LDL Cholesterol: 71 mg/dL (ref <130)
TRIGLYCERIDES: 131 mg/dL (ref <150)
VLDL: 26 mg/dL (ref <30)

## 2016-02-23 LAB — HEMOCCULT GUIAC POC 1CARD (OFFICE)

## 2016-02-23 LAB — COMPREHENSIVE METABOLIC PANEL
ALK PHOS: 51 U/L (ref 40–115)
ALT: 20 U/L (ref 9–46)
AST: 26 U/L (ref 10–35)
Albumin: 4.4 g/dL (ref 3.6–5.1)
BUN: 11 mg/dL (ref 7–25)
CALCIUM: 9.5 mg/dL (ref 8.6–10.3)
CHLORIDE: 105 mmol/L (ref 98–110)
CO2: 26 mmol/L (ref 20–31)
Creat: 0.8 mg/dL (ref 0.70–1.25)
Glucose, Bld: 97 mg/dL (ref 65–99)
POTASSIUM: 4.5 mmol/L (ref 3.5–5.3)
Sodium: 140 mmol/L (ref 135–146)
TOTAL PROTEIN: 6.8 g/dL (ref 6.1–8.1)
Total Bilirubin: 0.9 mg/dL (ref 0.2–1.2)

## 2016-02-23 LAB — PSA: PSA: 1.5 ng/mL (ref <=4.0)

## 2016-02-23 MED ORDER — ATORVASTATIN CALCIUM 20 MG PO TABS: 20.0000 mg | ORAL_TABLET | Freq: Every day | ORAL | 3 refills | Status: DC

## 2016-02-23 NOTE — Progress Notes (Signed)
   Subjective:    Patient ID: Adam Rosales, male    DOB: 07/14/1950, 65 y.o.   MRN: [id number]  HPI He is here for complete examination. He has noted some difficulty recently with polyuria but states he can sleep through the whole night without much trouble. He does have some difficulty during the day with urinating but feeling like he can completely empty and then an hour and half later going again. He does not complain of hesitancy or decreased stream. He continues on his atorvastatin and is having no difficulty with this. He is also on other OTC medications. He does have a history of hyperlipidemia as well as family history for prostate cancer. His weight is unchanged. Work and home life are going well. Family and social history as well as health maintenance and immunizations were reviewed. He has no other concerns or complaints.   Review of Systems  All other systems reviewed and are negative.      Objective:   Physical Exam BP 134/90   Pulse 86   Ht 5\' 11"  (1.803 m)   Wt 236 lb (107 kg)   BMI 32.92 kg/m   General Appearance:    Alert, cooperative, no distress, appears stated age  Head:    Normocephalic, without obvious abnormality, atraumatic  Eyes:    PERRL, conjunctiva/corneas clear, EOM's intact, fundi    benign  Ears:    Normal TM's and external ear canals  Nose:   Nares normal, mucosa normal, no drainage or sinus   tenderness  Throat:   Lips, mucosa, and tongue normal; teeth and gums normal  Neck:   Supple, no lymphadenopathy;  thyroid:  no   enlargement/tenderness/nodules; no carotid   bruit or JVD     Lungs:     Clear to auscultation bilaterally without wheezes, rales or     ronchi; respirations unlabored      Heart:    Regular rate and rhythm, S1 and S2 normal, no murmur, rub   or gallop     Abdomen:     Soft, non-tender, nondistended, normoactive bowel sounds,    no masses, no hepatosplenomegaly  Genitalia:   Deferred   Rectal:    Normal sphincter tone, no  masses or tenderness; guaiac negative stool.  Prostate smooth, no nodules, not enlarged.  Extremities:   No clubbing, cyanosis or edema  Pulses:   2+ and symmetric all extremities  Skin:   Skin color, texture, turgor normal, no rashes or lesions  Lymph nodes:   Cervical, supraclavicular, and axillary nodes normal  Neurologic:   CNII-XII intact, normal strength, sensation and gait; reflexes 2+ and symmetric throughout          Psych:   Normal mood, affect, hygiene and grooming.         Assessment & Plan:  Hyperlipidemia with target LDL less than 100 - Plan: Lipid panel, atorvastatin (LIPITOR) 20 MG tablet  Obesity (BMI 30-39.9) - Plan: CBC with Differential/Platelet, Comprehensive metabolic panel, Lipid panel  Family history of prostate cancer - Plan: PSA  Encounter for long-term (current) use of medications - Plan: CBC with Differential/Platelet, Comprehensive metabolic panel, Lipid panel  Routine general medical examination at a health care facility - Plan: CBC with Differential/Platelet, Comprehensive metabolic panel, Lipid panel, PSA Continue on present medication regimen. Discussed his urinary symptoms and at this point no further intervention is really needed. We will watch this and if this gets worse, readdress the issue. He is comfortable with that.

## 2016-08-09 ENCOUNTER — Ambulatory Visit: Payer: BC Managed Care – PPO

## 2017-01-04 ENCOUNTER — Ambulatory Visit
Admission: RE | Admit: 2017-01-04 | Discharge: 2017-01-04 | Disposition: A | Discharge status: Home / Self Care | Payer: BC Managed Care – PPO | Source: Ambulatory Visit | Attending: Family Medicine | Admitting: Family Medicine

## 2017-01-04 ENCOUNTER — Ambulatory Visit (INDEPENDENT_AMBULATORY_CARE_PROVIDER_SITE_OTHER): Payer: BC Managed Care – PPO | Admitting: Family Medicine

## 2017-01-04 ENCOUNTER — Encounter: Payer: Self-pay | Admitting: Family Medicine

## 2017-01-04 VITALS — BP 110/70 | HR 66 | Temp 98.1°F | Resp 16 | Wt 241.0 lb

## 2017-01-04 DIAGNOSIS — R1012 Left upper quadrant pain: Secondary | ICD-10-CM

## 2017-01-04 DIAGNOSIS — I700 Atherosclerosis of aorta: Secondary | ICD-10-CM | POA: Insufficient documentation

## 2017-01-04 DIAGNOSIS — R197 Diarrhea, unspecified: Secondary | ICD-10-CM

## 2017-01-04 DIAGNOSIS — N200 Calculus of kidney: Secondary | ICD-10-CM | POA: Insufficient documentation

## 2017-01-04 LAB — POCT URINALYSIS DIP (PROADVANTAGE DEVICE)
BILIRUBIN UA: NEGATIVE
BILIRUBIN UA: NEGATIVE mg/dL
GLUCOSE UA: NEGATIVE mg/dL
Leukocytes, UA: NEGATIVE
Nitrite, UA: NEGATIVE
Protein Ur, POC: NEGATIVE mg/dL
SPECIFIC GRAVITY, URINE: 1.03
Urobilinogen, Ur: NEGATIVE
pH, UA: 6 (ref 5.0–8.0)

## 2017-01-04 LAB — CBC WITH DIFFERENTIAL/PLATELET
Basophils Absolute: 48 cells/uL (ref 0–200)
Basophils Relative: 0.6 %
EOS PCT: 2.7 %
Eosinophils Absolute: 216 cells/uL (ref 15–500)
HCT: 45.7 % (ref 38.5–50.0)
Hemoglobin: 16 g/dL (ref 13.2–17.1)
Lymphs Abs: 2176 cells/uL (ref 850–3900)
MCH: 31.6 pg (ref 27.0–33.0)
MCHC: 35 g/dL (ref 32.0–36.0)
MCV: 90.3 fL (ref 80.0–100.0)
MONOS PCT: 9 %
MPV: 10.1 fL (ref 7.5–12.5)
Neutro Abs: 4840 cells/uL (ref 1500–7800)
Neutrophils Relative %: 60.5 %
Platelets: 283 10*3/uL (ref 140–400)
RBC: 5.06 10*6/uL (ref 4.20–5.80)
RDW: 13 % (ref 11.0–15.0)
Total Lymphocyte: 27.2 %
WBC mixed population: 720 cells/uL (ref 200–950)
WBC: 8 10*3/uL (ref 3.8–10.8)

## 2017-01-04 LAB — BASIC METABOLIC PANEL
BUN: 14 mg/dL (ref 7–25)
CALCIUM: 9.1 mg/dL (ref 8.6–10.3)
CO2: 27 mmol/L (ref 20–32)
Chloride: 106 mmol/L (ref 98–110)
Creat: 0.97 mg/dL (ref 0.70–1.25)
GLUCOSE: 139 mg/dL — AB (ref 65–99)
Potassium: 4.1 mmol/L (ref 3.5–5.3)
Sodium: 141 mmol/L (ref 135–146)

## 2017-01-04 LAB — HEMOCCULT GUIAC POC 1CARD (OFFICE)

## 2017-01-04 MED ORDER — KETOROLAC TROMETHAMINE 60 MG/2ML IM SOLN
60.0000 mg | Freq: Once | INTRAMUSCULAR | Status: AC
  Administered 2017-01-04: 60 mg via INTRAMUSCULAR

## 2017-01-04 MED ORDER — HYDROCODONE-ACETAMINOPHEN 5-325 MG PO TABS: 1.0000 | ORAL_TABLET | Freq: Four times a day (QID) | ORAL | 0 refills | Status: DC | PRN

## 2017-01-04 NOTE — Progress Notes (Addendum)
   Subjective:    Patient ID: Adam Rosales, male    DOB: [birth year], 66 y.o.   MRN: [id number]  HPI He complains of a one-month history of watery diarrhea but no fever, chills, blood in the stool, nausea or vomiting. He has been on no antibiotics recently. His last trip was to [place] in May. She had colonoscopy in 2009. Today he woke up and complains of worsening of left upper abdominal and flank pain but no dysuria urgency.   Review of Systems     Objective:   Physical Exam Alert, pale and in moderate distress. Cardiac exam shows regular rhythm without murmurs or gallops. Lungs are clear to auscultation. Abdominal exam shows no bowel sounds. No hepatosplenomegaly rebound or referred pain. Genitalia normal. Rectal exam showed no stool in the vault and was guaiac negative Urine microscopic did show red blood cells.      Assessment & Plan:  Left upper quadrant pain - Plan: POCT Urinalysis DIP (Proadvantage Device), POCT occult blood stool, CBC with Differential/Platelet, Basic Metabolic Panel, CT ABDOMEN PELVIS WO CONTRAST, HYDROcodone-acetaminophen (NORCO) 5-325 MG tablet, ketorolac (TORADOL) injection 60 mg  Diarrhea, unspecified type - Plan: POCT occult blood stool, CBC with Differential/Platelet, Basic Metabolic Panel  Aortic atherosclerosis (HCC)  Renal stone  Prescription also written for stool for O and P, C. difficile toxin and culture. If the scan comes out negative, I will pursue the diarrhea further.  The CT scan did show a renal stone. He was informed of this. He will take the pain medication and drink plenty fluids. We'll also send him off for stool for ova and parasites etc. He will follow-up with me tomorrow. Toradol injection also given. Also aortic atherosclerosis was seen.

## 2017-01-05 ENCOUNTER — Other Ambulatory Visit: Payer: Self-pay | Admitting: Family Medicine

## 2017-01-05 ENCOUNTER — Telehealth: Payer: Self-pay | Admitting: Family Medicine

## 2017-01-05 NOTE — Telephone Encounter (Signed)
Pain is about the same as yesterday.  Pain scale 6 -7 out of 10.  It comes and goes, in same place.  The diarrhea has gone away.  The codiene is helping.  Please advise [phone]

## 2017-01-11 ENCOUNTER — Telehealth: Payer: Self-pay

## 2017-01-11 NOTE — Telephone Encounter (Signed)
LM for pt to CB.  Stool studies negative. Need to inquire how pt is feeling. Adam Rosales December

## 2017-01-12 MED ORDER — METRONIDAZOLE 500 MG PO TABS: 500.0000 mg | ORAL_TABLET | Freq: Three times a day (TID) | ORAL | 0 refills | Status: DC

## 2017-01-12 NOTE — Telephone Encounter (Signed)
LMTCB

## 2017-01-12 NOTE — Telephone Encounter (Signed)
Tell him and put him on an antibiotic and he is to avoid alcohol while taking this. Heavy let me know how he does with his diarrhea

## 2017-01-12 NOTE — Telephone Encounter (Signed)
Pt aware of ABX called in. Advised to call in if no improvement. Victorino December

## 2017-01-12 NOTE — Telephone Encounter (Signed)
Pt called back & states regarding kidney stone, has felt fine for 2 days hopes he's passed it, not sure.   Regarding diarrhea, it's the same, it continues.

## 2017-01-14 ENCOUNTER — Encounter: Payer: Self-pay | Admitting: Family Medicine

## 2017-01-17 ENCOUNTER — Other Ambulatory Visit: Payer: Self-pay

## 2017-01-17 DIAGNOSIS — R197 Diarrhea, unspecified: Secondary | ICD-10-CM

## 2017-01-17 LAB — C. DIFFICILE GDH AND TOXIN A/B
GDH ANTIGEN: NOT DETECTED
GDH ANTIGEN: NOT DETECTED
MICRO NUMBER:: 81008026
MICRO NUMBER:: 81008046
SPECIMEN QUALITY: ADEQUATE
SPECIMEN QUALITY:: ADEQUATE
TOXIN A AND B: NOT DETECTED
TOXIN A AND B: NOT DETECTED

## 2017-01-17 LAB — STOOL CULTURE
MICRO NUMBER: 81007491
MICRO NUMBER: 81007492
MICRO NUMBER: 81007525
MICRO NUMBER:: 81007493
MICRO NUMBER:: 81007523
MICRO NUMBER:: 81007524
SHIGA RESULT: NOT DETECTED
SHIGA RESULT: NOT DETECTED
SPECIMEN QUALITY: ADEQUATE
SPECIMEN QUALITY: ADEQUATE
SPECIMEN QUALITY:: ADEQUATE
SPECIMEN QUALITY:: ADEQUATE
SPECIMEN QUALITY:: ADEQUATE
SPECIMEN QUALITY:: ADEQUATE

## 2017-01-17 LAB — OVA AND PARASITE EXAMINATION
CONCENTRATE RESULT: NONE SEEN
CONCENTRATE RESULT: NONE SEEN
MICRO NUMBER:: 81007413
MICRO NUMBER:: 81007415
SPECIMEN QUALITY:: ADEQUATE
SPECIMEN QUALITY:: ADEQUATE
TRICHROME RESULT: NONE SEEN
TRICHROME RESULT:: NONE SEEN

## 2017-01-18 ENCOUNTER — Encounter: Payer: Self-pay | Admitting: Gastroenterology

## 2017-01-31 ENCOUNTER — Encounter: Payer: Self-pay | Admitting: Family Medicine

## 2017-01-31 ENCOUNTER — Ambulatory Visit (INDEPENDENT_AMBULATORY_CARE_PROVIDER_SITE_OTHER): Payer: BC Managed Care – PPO | Admitting: Family Medicine

## 2017-01-31 VITALS — BP 122/90 | HR 94 | Temp 99.0°F | Resp 16 | Ht 71.0 in | Wt 236.0 lb

## 2017-01-31 DIAGNOSIS — N200 Calculus of kidney: Secondary | ICD-10-CM

## 2017-01-31 DIAGNOSIS — Z23 Encounter for immunization: Secondary | ICD-10-CM | POA: Diagnosis not present

## 2017-01-31 DIAGNOSIS — R197 Diarrhea, unspecified: Secondary | ICD-10-CM

## 2017-01-31 NOTE — Progress Notes (Signed)
   Subjective:    Patient ID: Adam Rosales, male    DOB: [birth year], 66 y.o.   MRN: [id number]  HPI He is here for recheck. He has a previous history of stone. He had another episode of left flank pain however the present time the pain has diminished. He has had no fever, chills, hematuria. He continues have difficulty with diarrhea but did note to when he was using codeine for his stone pain, the diarrhea did diminish. He is scheduled to see GI in the near future.   Review of Systems     Objective:   Physical Exam Alert and in no distress otherwise not examined       Assessment & Plan:  Need for shingles vaccine - Plan: Varicella-zoster vaccine IM (Shingrix)  Need for influenza vaccination - Plan: Flu vaccine HIGH DOSE PF (Fluzone High dose)  Diarrhea, unspecified type  Renal stone I discussed the treatment of the kidney stones with him. If he continues have difficulty with them we will need to have him strain his urine. He will follow-up with GI concerning the diarrhea as a workup so far has been negative.

## 2017-01-31 NOTE — Patient Instructions (Signed)
Definitely use probiotics and you can use Imodium as well. You can take as many as 8 a day

## 2017-03-02 ENCOUNTER — Other Ambulatory Visit: Payer: Self-pay | Admitting: Family Medicine

## 2017-03-02 DIAGNOSIS — E785 Hyperlipidemia, unspecified: Secondary | ICD-10-CM

## 2017-03-02 NOTE — Telephone Encounter (Signed)
Called and left message for pt to call back and schedule appt with Dr. Redmond School as he is due

## 2017-03-09 ENCOUNTER — Other Ambulatory Visit: Payer: Self-pay | Admitting: Family Medicine

## 2017-03-09 DIAGNOSIS — E785 Hyperlipidemia, unspecified: Secondary | ICD-10-CM

## 2017-03-09 MED ORDER — ATORVASTATIN CALCIUM 20 MG PO TABS: 20.0000 mg | ORAL_TABLET | Freq: Every day | ORAL | 3 refills | Status: DC

## 2017-03-14 ENCOUNTER — Encounter (INDEPENDENT_AMBULATORY_CARE_PROVIDER_SITE_OTHER): Payer: Self-pay

## 2017-03-14 ENCOUNTER — Encounter: Payer: Self-pay | Admitting: Gastroenterology

## 2017-03-14 ENCOUNTER — Ambulatory Visit: Payer: BC Managed Care – PPO | Admitting: Gastroenterology

## 2017-03-14 VITALS — BP 124/70 | HR 68 | Ht 70.25 in | Wt 234.4 lb

## 2017-03-14 DIAGNOSIS — R197 Diarrhea, unspecified: Secondary | ICD-10-CM

## 2017-03-14 DIAGNOSIS — Z1211 Encounter for screening for malignant neoplasm of colon: Secondary | ICD-10-CM

## 2017-03-14 MED ORDER — NA SULFATE-K SULFATE-MG SULF 17.5-3.13-1.6 GM/177ML PO SOLN: 1.0000 | Freq: Once | ORAL | 0 refills | Status: AC

## 2017-03-14 NOTE — Progress Notes (Signed)
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    Chief complaint is chronic loose stools  Since august, very loose stools, 3-4 times per day.  Whitish mucous.  Never nocturnal symptoms  Previously moved his bowels once per day per day.  Tried abx he cannot recall what type.  This did not help at all.,   He might have lost a little bit of weight.  He takes Ocuvite and fish oil.  The Ocuvite medicine seems to have changed recently from a pill to a liquid.  Old Data Reviewed: Colonoscopy 2009, I cannot see  the report or any associated pathology today  September 2018 stool testing ova parasites negative, routine stool culture negative, Clostridium difficile negative.  CT scan abdomen and pelvis without IV contrast September 2018 for microscopic hematuria, left-sided flank pain, diarrhea 1. Distal left ureteric stone with mild secondary hydroureteronephrosis. 2.  Tiny hiatal hernia. 3. Recurrent or residual fat containing right inguinal hernia, status post repair. 4.  Aortic Atherosclerosis (ICD10-I70.0).  Review of systems: Pertinent positive and negative review of systems were noted in the above HPI section. All other review negative.   Past Medical History:  Diagnosis Date  . Dyslipidemia   . Hyperlipidemia   . Kidney stones     Past Surgical History:  Procedure Laterality Date  . COLONOSCOPY  2009   Dr.Mann  . INGUINAL HERNIA REPAIR Left     Current Outpatient Medications  Medication Sig Dispense Refill  . aspirin EC 81 MG tablet Take 81 mg by mouth daily.    Marland Kitchen atorvastatin (LIPITOR) 20 MG tablet Take 1 tablet (20 mg total) daily by mouth. 90 tablet 3  . beta carotene w/minerals (OCUVITE) tablet Take 1 tablet by mouth daily.    . fish oil-omega-3 fatty acids 1000 MG capsule Take 1 g by mouth daily.      No current facility-administered medications for this  visit.     Allergies as of 03/14/2017  . (No Known Allergies)    Family History  Problem Relation Age of Onset  . Heart disease Father 36  . Heart attack Father   . Heart disease Maternal Grandfather   . Heart disease Paternal Grandfather   . Lung cancer Paternal Grandfather   . Prostate cancer Brother   . Colon cancer Brother   . Other Brother        Agent orange    Social History   Socioeconomic History  . Marital status: Married    Spouse name: Not on file  . Number of children: 2  . Years of education: Not on file  . Highest education level: Not on file  Social Needs  . Financial resource strain: Not on file  . Food insecurity - worry: Not on file  . Food insecurity - inability: Not on file  . Transportation needs - medical: Not on file  . Transportation needs - non-medical: Not on file  Occupational History  . Not on file  Tobacco Use  . Smoking status: Never Smoker  . Smokeless tobacco: Never Used  Substance and Sexual Activity  . Alcohol use: Yes    Comment: less than 1 per day  . Drug use: No  . Sexual activity: Yes  Other Topics Concern  . Not on file  Social History Narrative  . Not on file     Physical Exam: BP 124/70 (BP  Location: Left Arm, Patient Position: Sitting, Cuff Size: Normal)   Pulse 68   Ht 5' 10.25" (1.784 m) Comment: height measured without shoes  Wt 234 lb 6 oz (106.3 kg)   BMI 33.39 kg/m  Constitutional: generally well-appearing Psychiatric: alert and oriented x3 Eyes: extraocular movements intact Mouth: oral pharynx moist, no lesions Neck: supple no lymphadenopathy Cardiovascular: heart regular rate and rhythm Lungs: clear to auscultation bilaterally Abdomen: soft, nontender, nondistended, no obvious ascites, no peritoneal signs, normal bowel sounds Extremities: no lower extremity edema bilaterally Skin: no lesions on visible extremities   Assessment and plan: 66 y.o. male with chronic loose stools  Ocuvite and fish  oil  can both contribute to loose stools.  He is going to try stopping those medicines to see if it makes any difference and if that does not help he will start a single Imodium every morning shortly after waking.  I also recommended colonoscopy for colon cancer screening which he is due for in the next 2 or 3 months anyway.  I see no reason for any further blood tests or imaging studies.    Please see the "Patient Instructions" section for addition details about the plan.   Owens Loffler, MD [facility] 03/14/2017, 9:28 AM  Cc: Denita Lung, MD

## 2017-03-14 NOTE — Patient Instructions (Addendum)
Stop fish oil, stop ocuvite to see if this helps your diarrhea.  If not, start one imodium once daily.  You will be set up for a colonoscopy for cancer screening.  Normal BMI (Body Mass Index- based on height and weight) is between 23 and 30. Your BMI today is Body mass index is 33.39 kg/m. Marland Kitchen Please consider follow up  regarding your BMI with your Primary Care Provider.

## 2017-03-25 ENCOUNTER — Ambulatory Visit (AMBULATORY_SURGERY_CENTER): Payer: BC Managed Care – PPO | Admitting: Gastroenterology

## 2017-03-25 ENCOUNTER — Other Ambulatory Visit: Payer: Self-pay

## 2017-03-25 ENCOUNTER — Encounter: Payer: Self-pay | Admitting: Gastroenterology

## 2017-03-25 VITALS — BP 129/82 | HR 78 | Temp 97.8°F | Resp 19 | Ht 70.0 in | Wt 234.0 lb

## 2017-03-25 DIAGNOSIS — K6389 Other specified diseases of intestine: Secondary | ICD-10-CM | POA: Diagnosis not present

## 2017-03-25 DIAGNOSIS — D124 Benign neoplasm of descending colon: Secondary | ICD-10-CM | POA: Diagnosis not present

## 2017-03-25 DIAGNOSIS — D120 Benign neoplasm of cecum: Secondary | ICD-10-CM

## 2017-03-25 DIAGNOSIS — R197 Diarrhea, unspecified: Secondary | ICD-10-CM

## 2017-03-25 DIAGNOSIS — K5730 Diverticulosis of large intestine without perforation or abscess without bleeding: Secondary | ICD-10-CM

## 2017-03-25 DIAGNOSIS — Z1211 Encounter for screening for malignant neoplasm of colon: Secondary | ICD-10-CM

## 2017-03-25 MED ORDER — SODIUM CHLORIDE 0.9 % IV SOLN: 500.0000 mL | INTRAVENOUS | Status: DC

## 2017-03-25 NOTE — Progress Notes (Signed)
Pt's states no medical or surgical changes since previsit or office visit. 

## 2017-03-25 NOTE — Patient Instructions (Signed)
Impression/Recommendations:  Polyp handout given to patient. Diverticulosis handout given to patient.  Resume previous diet. Continue present medications.  Repeat colonoscopy recommended for surveillance.  Date to be determined based on pathology results.  YOU HAD AN ENDOSCOPIC PROCEDURE TODAY AT [facility]:   Refer to the procedure report that was given to you for any specific questions about what was found during the examination.  If the procedure report does not answer your questions, please call your gastroenterologist to clarify.  If you requested that your care partner not be given the details of your procedure findings, then the procedure report has been included in a sealed envelope for you to review at your convenience later.  YOU SHOULD EXPECT: Some feelings of bloating in the abdomen. Passage of more gas than usual.  Walking can help get rid of the air that was put into your GI tract during the procedure and reduce the bloating. If you had a lower endoscopy (such as a colonoscopy or flexible sigmoidoscopy) you may notice spotting of blood in your stool or on the toilet paper. If you underwent a bowel prep for your procedure, you may not have a normal bowel movement for a few days.  Please Note:  You might notice some irritation and congestion in your nose or some drainage.  This is from the oxygen used during your procedure.  There is no need for concern and it should clear up in a day or so.  SYMPTOMS TO REPORT IMMEDIATELY:   Following lower endoscopy (colonoscopy or flexible sigmoidoscopy):  Excessive amounts of blood in the stool  Significant tenderness or worsening of abdominal pains  Swelling of the abdomen that is new, acute  Fever of 100F or higher For urgent or emergent issues, a gastroenterologist can be reached at any hour by calling [phone].   DIET:  We do recommend a small meal at first, but then you may proceed to your regular diet.  Drink  plenty of fluids but you should avoid alcoholic beverages for 24 hours.  ACTIVITY:  You should plan to take it easy for the rest of today and you should NOT DRIVE or use heavy machinery until tomorrow (because of the sedation medicines used during the test).    FOLLOW UP: Our staff will call the number listed on your records the next business day following your procedure to check on you and address any questions or concerns that you may have regarding the information given to you following your procedure. If we do not reach you, we will leave a message.  However, if you are feeling well and you are not experiencing any problems, there is no need to return our call.  We will assume that you have returned to your regular daily activities without incident.  If any biopsies were taken you will be contacted by phone or by letter within the next 1-3 weeks.  Please call us at [phone] if you have not heard about the biopsies in 3 weeks.    SIGNATURES/CONFIDENTIALITY: You and/or your care partner have signed paperwork which will be entered into your electronic medical record.  These signatures attest to the fact that that the information above on your After Visit Summary has been reviewed and is understood.  Full responsibility of the confidentiality of this discharge information lies with you and/or your care-partner.

## 2017-03-25 NOTE — Progress Notes (Signed)
Report to PACU, RN, vss, BBS= Clear.  

## 2017-03-25 NOTE — Op Note (Addendum)
[facility] Patient Name: Adam Rosales Procedure Date: 03/25/2017 3:10 PM MRN: [id number] Endoscopist: Milus Banister , MD Age: 66 Referring MD:  Date of Birth: 02/08/1951 Gender: Male Account #: [id number] Procedure:                Colonoscopy Indications:              Chronic diarrhea Medicines:                Monitored Anesthesia Care Procedure:                Pre-Anesthesia Assessment:                           - Prior to the procedure, a History and Physical                            was performed, and patient medications and                            allergies were reviewed. The patient's tolerance of                            previous anesthesia was also reviewed. The risks                            and benefits of the procedure and the sedation                            options and risks were discussed with the patient.                            All questions were answered, and informed consent                            was obtained. Prior Anticoagulants: The patient has                            taken no previous anticoagulant or antiplatelet                            agents. ASA Grade Assessment: II - A patient with                            mild systemic disease. After reviewing the risks                            and benefits, the patient was deemed in                            satisfactory condition to undergo the procedure.                           After obtaining informed consent, the colonoscope  was passed under direct vision. Throughout the                            procedure, the patient's blood pressure, pulse, and                            oxygen saturations were monitored continuously. The                            Model CF-HQ190L [id number]) scope was introduced                            through the anus and advanced to the the terminal                            ileum. The colonoscopy was performed  without                            difficulty. The patient tolerated the procedure                            well. The quality of the bowel preparation was                            good. The terminal ileum, ileocecal valve,                            appendiceal orifice, and rectum were photographed. Scope In: 3:14:01 PM Scope Out: 3:25:45 PM Scope Withdrawal Time: 0 hours 9 minutes 25 seconds  Total Procedure Duration: 0 hours 11 minutes 44 seconds  Findings:                 The terminal ileum appeared normal. Biopsies were                            taken with a cold forceps for histology.                           Two sessile polyps were found in the descending                            colon and cecum. The polyps were 3 to 4 mm in size.                            These polyps were removed with a cold snare.                            Resection and retrieval were complete.                           Left colon diverticulosis.                           The exam was otherwise without abnormality on  direct and retroflexion views.                           Biopsies for histology were taken with a cold                            forceps from the entire colon for evaluation of                            microscopic colitis. Complications:            No immediate complications. Estimated blood loss:                            None. Estimated Blood Loss:     Estimated blood loss: none. Impression:               - The examined portion of the ileum was normal.                            Biopsied.                           - Two 3 to 4 mm polyps in the descending colon and                            in the cecum, removed with a cold snare. Resected                            and retrieved.                           - Left colon diverticulosis.                           - The examination was otherwise normal on direct                            and retroflexion  views.                           - Biopsies were taken with a cold forceps from the                            entire colon for evaluation of microscopic colitis. Recommendation:           - Patient has a contact number available for                            emergencies. The signs and symptoms of potential                            delayed complications were discussed with the                            patient. Return to normal activities tomorrow.  Written discharge instructions were provided to the                            patient.                           - Resume previous diet.                           - Continue present medications.                           You will receive a letter within 2-3 weeks with the                            pathology results and my final recommendations.                           If the polyp(s) is proven to be 'pre-cancerous' on                            pathology, you will need repeat colonoscopy in 5                            years. If the polyp(s) is NOT 'precancerous' on                            pathology then you should repeat colon cancer                            screening in 10 years with colonoscopy without need                            for colon cancer screening by any method prior to                            then (including stool testing).                           If the biopsies do NOT show microscopic colitis,                            will likely recommend a trial of daily, scheduled                            imodium. Milus Banister, MD 03/25/2017 3:34:01 PM This report has been signed electronically.

## 2017-03-25 NOTE — Progress Notes (Signed)
Called to room to assist during endoscopic procedure.  Patient ID and intended procedure confirmed with present staff. Received instructions for my participation in the procedure from the performing physician.  

## 2017-03-28 ENCOUNTER — Telehealth: Payer: Self-pay | Admitting: *Deleted

## 2017-03-28 ENCOUNTER — Telehealth: Payer: Self-pay

## 2017-03-28 NOTE — Telephone Encounter (Signed)
Left message on answering machine. 

## 2017-03-28 NOTE — Telephone Encounter (Signed)
  Follow up Call-  Call back number 03/25/2017  Post procedure Call Back phone  # [phone]  Permission to leave phone message Yes  Some recent data might be hidden     Patient questions:  Do you have a fever, pain , or abdominal swelling? No. Pain Score  0 *  Have you tolerated food without any problems? Yes.    Have you been able to return to your normal activities? Yes.    Do you have any questions about your discharge instructions: Diet   No. Medications  No. Follow up visit  No.  Do you have questions or concerns about your Care? Yes.   Patient reports that his diarrhea has worsened some since the procedure but he had bx for microscopic colitis and was having this prior to the procedure. Encouraged him to try the Imodium that Dr. Ardis Hughs mentioned to him and await the bx results. SM Actions: * If pain score is 4 or above: No action needed, pain <4.

## 2017-03-28 NOTE — Telephone Encounter (Signed)
Patient states he is fine still has diarrhea and does not need a call back at this time.

## 2017-05-18 ENCOUNTER — Other Ambulatory Visit: Payer: Self-pay

## 2017-05-18 ENCOUNTER — Telehealth: Payer: Self-pay | Admitting: Family Medicine

## 2017-05-18 DIAGNOSIS — E785 Hyperlipidemia, unspecified: Secondary | ICD-10-CM

## 2017-05-18 MED ORDER — ATORVASTATIN CALCIUM 20 MG PO TABS: 20.0000 mg | ORAL_TABLET | Freq: Every day | ORAL | 3 refills | Status: DC

## 2017-05-18 NOTE — Telephone Encounter (Signed)
Called and notified the pt that he has 2 refill on his meds ,he shouldn't need anymore refill on meds.

## 2017-05-18 NOTE — Telephone Encounter (Signed)
Pt called and stated he will be out of atorvastatin before his appt in march. Please send to [facility] on [address]. Pt can be reached at [phone].

## 2017-05-23 ENCOUNTER — Encounter: Payer: BC Managed Care – PPO | Admitting: Family Medicine

## 2017-07-18 ENCOUNTER — Ambulatory Visit: Payer: BC Managed Care – PPO | Admitting: Family Medicine

## 2017-07-18 ENCOUNTER — Encounter: Payer: Self-pay | Admitting: Family Medicine

## 2017-07-18 VITALS — BP 154/82 | HR 85 | Ht 71.0 in | Wt 237.6 lb

## 2017-07-18 DIAGNOSIS — Z0000 Encounter for general adult medical examination without abnormal findings: Secondary | ICD-10-CM

## 2017-07-18 DIAGNOSIS — Z1159 Encounter for screening for other viral diseases: Secondary | ICD-10-CM | POA: Diagnosis not present

## 2017-07-18 DIAGNOSIS — N200 Calculus of kidney: Secondary | ICD-10-CM | POA: Diagnosis not present

## 2017-07-18 DIAGNOSIS — Z8042 Family history of malignant neoplasm of prostate: Secondary | ICD-10-CM | POA: Diagnosis not present

## 2017-07-18 DIAGNOSIS — E669 Obesity, unspecified: Secondary | ICD-10-CM | POA: Diagnosis not present

## 2017-07-18 DIAGNOSIS — I700 Atherosclerosis of aorta: Secondary | ICD-10-CM

## 2017-07-18 DIAGNOSIS — Z23 Encounter for immunization: Secondary | ICD-10-CM

## 2017-07-18 DIAGNOSIS — E785 Hyperlipidemia, unspecified: Secondary | ICD-10-CM

## 2017-07-18 LAB — POCT URINALYSIS DIP (PROADVANTAGE DEVICE)
BILIRUBIN UA: NEGATIVE mg/dL
Bilirubin, UA: NEGATIVE
Glucose, UA: NEGATIVE mg/dL
LEUKOCYTES UA: NEGATIVE
NITRITE UA: NEGATIVE
PH UA: 6.5 (ref 5.0–8.0)
PROTEIN UA: NEGATIVE mg/dL
RBC UA: NEGATIVE
Specific Gravity, Urine: 1.01
Urobilinogen, Ur: 3.5

## 2017-07-18 MED ORDER — ATORVASTATIN CALCIUM 20 MG PO TABS: 20.0000 mg | ORAL_TABLET | Freq: Every day | ORAL | 3 refills | Status: DC

## 2017-07-18 NOTE — Progress Notes (Signed)
Subjective:    Patient ID: Adam Rosales, male    DOB: 02-08-1951, 67 y.o.   MRN: [id number]  HPI He is here for complete examination.  He has been under a lot of stress due to work related issues but he is happy with this.  He also has had some home issues to deal with.  His daughter is bipolar and apparently cannot hold a job.  They are in the process of trying to figure out exactly how to help her but not get caught up in all of her issues.  He continues on atorvastatin as well as aspirin and is having no trouble with him.  He does use omega-3.  He has had difficulties with diarrhea recently and had a GI evaluation.  It was recommended that he use Imodium. He also has a history of renal stones but has not had any recently he does have a family history of prostate cancer.  Otherwise his family and social history as well as health maintenance and immunizations was unchanged.  Of note is these planning to retire within the next couple of years.   Review of Systems  All other systems reviewed and are negative.      Objective:   Physical Exam BP (!) 154/82 (BP Location: Left Arm, Patient Position: Sitting)   Pulse 85   Ht 5\' 11"  (1.803 m)   Wt 237 lb 9.6 oz (107.8 kg)   SpO2 98%   BMI 33.14 kg/m   General Appearance:    Alert, cooperative, no distress, appears stated age  Head:    Normocephalic, without obvious abnormality, atraumatic  Eyes:    PERRL, conjunctiva/corneas clear, EOM's intact, fundi    benign  Ears:    Normal TM's and external ear canals  Nose:   Nares normal, mucosa normal, no drainage or sinus   tenderness  Throat:   Lips, mucosa, and tongue normal; teeth and gums normal  Neck:   Supple, no lymphadenopathy;  thyroid:  no   enlargement/tenderness/nodules; no carotid   bruit or JVD     Lungs:     Clear to auscultation bilaterally without wheezes, rales or     ronchi; respirations unlabored      Heart:    Regular rate and rhythm, S1 and S2 normal, no murmur, rub  or gallop     Abdomen:     Soft, non-tender, nondistended, normoactive bowel sounds,    no masses, no hepatosplenomegaly  Genitalia:    Normal male external genitalia without lesions.  Testicles without masses.  No inguinal hernias.  Rectal:   Deferred  Extremities:   No clubbing, cyanosis or edema  Pulses:   2+ and symmetric all extremities  Skin:   Skin color, texture, turgor normal, no rashes or lesions  Lymph nodes:   Cervical, supraclavicular, and axillary nodes normal  Neurologic:   CNII-XII intact, normal strength, sensation and gait; reflexes 2+ and symmetric throughout          Psych:   Normal mood, affect, hygiene and grooming.          Assessment & Plan:  Routine general medical examination at a health care facility - Plan: CBC with Differential/Platelet, Comprehensive metabolic panel, Lipid panel, POCT Urinalysis DIP (Proadvantage Device)  Aortic atherosclerosis (HCC) - Plan: CBC with Differential/Platelet, Comprehensive metabolic panel, Lipid panel  Family history of prostate cancer - Plan: PSA  Hyperlipidemia with target LDL less than 100 - Plan: Lipid panel, atorvastatin (LIPITOR) 20 MG tablet  Obesity (BMI 30-39.9) - Plan: CBC with Differential/Platelet, Comprehensive metabolic panel, Lipid panel  Renal stone  Need for shingles vaccine - Plan: Varicella-zoster vaccine IM (Shingrix)  Need for Tdap vaccination - Plan: Tdap vaccine greater than or equal to 7yo IM  Need for hepatitis C screening test - Plan: Hepatitis C antibody His immunizations were updated as well as checking for hepatitis C.  Encouraged him and his wife to get involved in counseling to make sure that they do the right thing concerning her bipolar daughter. I also encouraged him to make further diet and exercise changes to help with his weight.

## 2017-07-19 LAB — COMPREHENSIVE METABOLIC PANEL
A/G RATIO: 1.8 (ref 1.2–2.2)
ALK PHOS: 68 IU/L (ref 39–117)
ALT: 20 IU/L (ref 0–44)
AST: 18 IU/L (ref 0–40)
Albumin: 4.4 g/dL (ref 3.6–4.8)
BILIRUBIN TOTAL: 0.7 mg/dL (ref 0.0–1.2)
BUN/Creatinine Ratio: 10 (ref 10–24)
BUN: 9 mg/dL (ref 8–27)
CHLORIDE: 104 mmol/L (ref 96–106)
CO2: 23 mmol/L (ref 20–29)
Calcium: 9.2 mg/dL (ref 8.6–10.2)
Creatinine, Ser: 0.92 mg/dL (ref 0.76–1.27)
GFR calc Af Amer: 100 mL/min/{1.73_m2} (ref >59)
GFR calc non Af Amer: 86 mL/min/{1.73_m2} (ref >59)
GLOBULIN, TOTAL: 2.5 g/dL (ref 1.5–4.5)
Glucose: 100 mg/dL — ABNORMAL HIGH (ref 65–99)
POTASSIUM: 4.4 mmol/L (ref 3.5–5.2)
SODIUM: 146 mmol/L — AB (ref 134–144)
Total Protein: 6.9 g/dL (ref 6.0–8.5)

## 2017-07-19 LAB — CBC WITH DIFFERENTIAL/PLATELET
Basophils Absolute: 0 10*3/uL (ref 0.0–0.2)
Basos: 1 %
EOS (ABSOLUTE): 0.4 10*3/uL (ref 0.0–0.4)
EOS: 5 %
HEMATOCRIT: 50.9 % (ref 37.5–51.0)
Hemoglobin: 17.2 g/dL (ref 13.0–17.7)
Immature Grans (Abs): 0 10*3/uL (ref 0.0–0.1)
Immature Granulocytes: 0 %
Lymphocytes Absolute: 1.6 10*3/uL (ref 0.7–3.1)
Lymphs: 19 %
MCH: 31.4 pg (ref 26.6–33.0)
MCHC: 33.8 g/dL (ref 31.5–35.7)
MCV: 93 fL (ref 79–97)
Monocytes Absolute: 1.1 10*3/uL — ABNORMAL HIGH (ref 0.1–0.9)
Monocytes: 13 %
Neutrophils Absolute: 5.3 10*3/uL (ref 1.4–7.0)
Neutrophils: 62 %
Platelets: 239 10*3/uL (ref 150–379)
RBC: 5.48 x10E6/uL (ref 4.14–5.80)
RDW: 13.8 % (ref 12.3–15.4)
WBC: 8.4 10*3/uL (ref 3.4–10.8)

## 2017-07-19 LAB — LIPID PANEL
CHOLESTEROL TOTAL: 133 mg/dL (ref 100–199)
Chol/HDL Ratio: 3.2 ratio (ref 0.0–5.0)
HDL: 42 mg/dL (ref >39)
LDL Calculated: 69 mg/dL (ref 0–99)
TRIGLYCERIDES: 109 mg/dL (ref 0–149)
VLDL Cholesterol Cal: 22 mg/dL (ref 5–40)

## 2017-07-19 LAB — PSA: PROSTATE SPECIFIC AG, SERUM: 2.6 ng/mL (ref 0.0–4.0)

## 2017-07-19 LAB — HEPATITIS C ANTIBODY

## 2018-07-26 ENCOUNTER — Other Ambulatory Visit: Payer: Self-pay

## 2018-07-26 ENCOUNTER — Ambulatory Visit: Payer: BC Managed Care – PPO | Admitting: Family Medicine

## 2018-07-26 ENCOUNTER — Encounter: Payer: Self-pay | Admitting: Family Medicine

## 2018-07-26 VITALS — Temp 98.0°F | Wt 240.0 lb

## 2018-07-26 DIAGNOSIS — H938X3 Other specified disorders of ear, bilateral: Secondary | ICD-10-CM | POA: Diagnosis not present

## 2018-07-26 NOTE — Progress Notes (Signed)
   Subjective:    Patient ID: Adam Rosales, male    DOB: Jan 27, 1951, 68 y.o.   MRN: [id number]   Documentation for virtual audio and video telecommunications through [place] encounter: The patient agreed to the consult. He was at home. 2 patient identifiers used.   The provider was located in the office. The patient did consent to this visit and is aware of possible charges through their insurance for this visit.     HPI Chief Complaint  Patient presents with  . clogged ears    clogged ears for a week, no ear pain. some itchy.    Complains of a 4-5 day history of bilateral ear fullness and mild intermittent tinnitus.  States he has been using Q-tips and he also tried using an out of date earwax removal kit 5 or 6 days ago. States he does have a history of ear infections but this does not feel like an infection.  He denies any pain or discharge. Denies fever, chills, headache, dizziness, rhinorrhea, nasal congestion, sore throat, cough, shortness of breath.  Reviewed allergies, medications, past medical, surgical, family, and social history.    Review of Systems Pertinent positives and negatives in the history of present illness.     Objective:   Physical Exam Temp 98 F (36.7 C) (Oral)   Wt 240 lb (108.9 kg)   BMI 33.47 kg/m  Alert and oriented and in no acute distress.  Speaking in complete sentences without difficulty.  I did have him tap on his maxillary sinuses and mastoid and no tenderness.      Assessment & Plan:  Ear fullness, bilateral Discussed that he does not appear to have an infection per subjective findings. Advised him to avoid using Q-tips.  He may try using a one-to-one ratio of hydrogen peroxide and lukewarm water and then flushing of his ears using an over-the-counter cerumen removal kit.  If he feels like he is having congestion then he may try over-the-counter Sudafed. He will let me know if his symptoms worsen or if he is not noticing any  improvement.  Information obtained using audio and video via app Zoom.  Time involving medical discussion was 8 minutes.  [id number] (5-97mn) [id number] (11-222m) [id number] (21-3080m

## 2018-08-25 ENCOUNTER — Telehealth: Payer: Self-pay

## 2018-08-25 NOTE — Telephone Encounter (Signed)
Patient wife called and stated patient is still having a hard time hearing. He tried some of the recommended things but it is still not working. Patient hearing has worsened and they would like to know what he should do now. He tried the peroxide several times in the last few days. Please advise.    Please call back at this number [phone]

## 2018-08-25 NOTE — Telephone Encounter (Signed)
He is going to need to come in for a visit

## 2018-08-28 NOTE — Telephone Encounter (Signed)
Pt has an apt 08-29-18. [place]

## 2018-08-29 ENCOUNTER — Other Ambulatory Visit: Payer: Self-pay

## 2018-08-29 ENCOUNTER — Ambulatory Visit: Payer: BC Managed Care – PPO | Admitting: Family Medicine

## 2018-08-29 ENCOUNTER — Encounter: Payer: Self-pay | Admitting: Family Medicine

## 2018-08-29 VITALS — BP 156/94 | HR 73 | Temp 98.0°F | Wt 249.6 lb

## 2018-08-29 DIAGNOSIS — H6123 Impacted cerumen, bilateral: Secondary | ICD-10-CM | POA: Diagnosis not present

## 2018-08-29 NOTE — Progress Notes (Signed)
   Subjective:    Patient ID: Adam Rosales, male    DOB: 1951/01/06, 68 y.o.   MRN: [id number]  HPI He has been having difficulty in the last week or so with decreased hearing.  He has been using eardrops but has had no real success cleaning his ears.  Review of Systems     Objective:   Physical Exam Alert and in no distress.  Cerumen was noted in both canals.       Assessment & Plan:  Bilateral impacted cerumen - Plan: Ambulatory referral to ENT Cerumen was lavaged from both canals however the TMs were not clearly seen.  I will refer to ENT for more definitive care.

## 2018-08-30 ENCOUNTER — Ambulatory Visit: Payer: Self-pay | Admitting: Family Medicine

## 2018-09-05 ENCOUNTER — Telehealth: Payer: Self-pay | Admitting: Family Medicine

## 2018-09-05 ENCOUNTER — Encounter: Payer: Self-pay | Admitting: Family Medicine

## 2018-09-05 ENCOUNTER — Ambulatory Visit: Payer: BC Managed Care – PPO | Admitting: Family Medicine

## 2018-09-05 ENCOUNTER — Other Ambulatory Visit: Payer: Self-pay

## 2018-09-05 VITALS — BP 150/96 | HR 71 | Temp 98.4°F | Wt 248.4 lb

## 2018-09-05 DIAGNOSIS — H9203 Otalgia, bilateral: Secondary | ICD-10-CM

## 2018-09-05 MED ORDER — AMOXICILLIN-POT CLAVULANATE 875-125 MG PO TABS: 1.0000 | ORAL_TABLET | Freq: Two times a day (BID) | ORAL | 0 refills | Status: DC

## 2018-09-05 NOTE — Telephone Encounter (Signed)
Pt is coming in to the office. [place]

## 2018-09-05 NOTE — Telephone Encounter (Signed)
Let him know he can always come back in here to see if we can clean his ear out better or see if we can get him in with a different ENT

## 2018-09-05 NOTE — Progress Notes (Signed)
   Subjective:    Patient ID: Adam Rosales, male    DOB: [birth year], 68 y.o.   MRN: [id number]  HPI He was seen recently and treated for cerumen impaction however since then he is now experienced more right-sided earache but no fever, chills, cough or congestion.  He was scheduled to see ENT however there was some mixup with communication.   Review of Systems     Objective:   Physical Exam Alert and in no distress.  Left TM was difficult to visualize with some debris still present in the canal with a narrow canal.  Right canal was narrow with excessive amount of hair making visualization of the TM impossible.  There was some pain with manipulation of the earlobe.       Assessment & Plan:  Otalgia of both ears - Plan: amoxicillin-clavulanate (AUGMENTIN) 875-125 MG tablet Difficult to say whether the poor visualization of the TM especially on the left was due to infection making for poor landmarks or whether it is a combination of that and some cerumen.  I will place him on the Augmentin, he will be scheduled to see ENT shortly after finishing the Augmentin.

## 2018-09-05 NOTE — Telephone Encounter (Signed)
Pt called and states he is having right ear pain that is a dual pain and every now and again it is sharpe, states the hearing loss is worse than it was when he was in th office, he called the ENT office and they can not get him on until the end of may, pt wants to know what he can do, please advise pt can be reached at [phone]

## 2018-09-25 ENCOUNTER — Encounter: Payer: Self-pay | Admitting: Family Medicine

## 2018-11-04 ENCOUNTER — Other Ambulatory Visit: Payer: Self-pay | Admitting: Family Medicine

## 2018-11-04 DIAGNOSIS — E785 Hyperlipidemia, unspecified: Secondary | ICD-10-CM

## 2018-12-20 ENCOUNTER — Ambulatory Visit: Payer: BC Managed Care – PPO | Admitting: Family Medicine

## 2018-12-20 ENCOUNTER — Other Ambulatory Visit: Payer: Self-pay

## 2018-12-20 ENCOUNTER — Encounter: Payer: Self-pay | Admitting: Family Medicine

## 2018-12-20 VITALS — BP 124/82 | HR 75 | Temp 99.6°F | Wt 242.6 lb

## 2018-12-20 DIAGNOSIS — E785 Hyperlipidemia, unspecified: Secondary | ICD-10-CM | POA: Diagnosis not present

## 2018-12-20 DIAGNOSIS — Z8042 Family history of malignant neoplasm of prostate: Secondary | ICD-10-CM | POA: Diagnosis not present

## 2018-12-20 DIAGNOSIS — E669 Obesity, unspecified: Secondary | ICD-10-CM

## 2018-12-20 DIAGNOSIS — L0293 Carbuncle, unspecified: Secondary | ICD-10-CM

## 2018-12-20 DIAGNOSIS — Z23 Encounter for immunization: Secondary | ICD-10-CM

## 2018-12-20 DIAGNOSIS — I700 Atherosclerosis of aorta: Secondary | ICD-10-CM

## 2018-12-20 MED ORDER — DOXYCYCLINE HYCLATE 100 MG PO TABS: 100.0000 mg | ORAL_TABLET | Freq: Two times a day (BID) | ORAL | 0 refills | Status: DC

## 2018-12-20 NOTE — Progress Notes (Signed)
   Subjective:    Patient ID: Adam Rosales, male    DOB: 1951-04-22, 68 y.o.   MRN: [id number]  HPI He has developed a painful red lesion superior to the left knee that started about 5 days ago.Marland Kitchen  He does not remember any insect bites or direct trauma to the area.  He also has a family history of prostate cancer as well as hyperlipidemia and atherosclerosis.  He does need follow-up blood work on all this.  He does take Lipitor regularly.   Review of Systems     Objective:   Physical Exam Alert and in no distress.  3 cm erythematous lesion with surrounding induration of approximately 5 cm is noted superior to the left patella.  18-gauge needle was inserted to see if there was any purulent material and none was noted.       Assessment & Plan:  Carbuncle - Plan: doxycycline (VIBRA-TABS) 100 MG tablet   he is to use warm compresses to the area and if an abscess forms he is to return here for more definitive care. Need for influenza vaccination - Plan: Flu Vaccine QUAD High Dose(Fluad)  Hyperlipidemia with target LDL less than 100 - Plan: Lipid panel  Family history of prostate cancer - Plan: PSA  Obesity (BMI 30-39.9) - Plan: CBC with Differential/Platelet, Comprehensive metabolic panel, Lipid panel  Aortic atherosclerosis (HCC) - Plan: Lipid panel

## 2018-12-21 LAB — CBC WITH DIFFERENTIAL/PLATELET
Basophils Absolute: 0.1 10*3/uL (ref 0.0–0.2)
Basos: 1 %
EOS (ABSOLUTE): 0.2 10*3/uL (ref 0.0–0.4)
Eos: 3 %
Hematocrit: 48.3 % (ref 37.5–51.0)
Hemoglobin: 16.8 g/dL (ref 13.0–17.7)
Immature Grans (Abs): 0 10*3/uL (ref 0.0–0.1)
Immature Granulocytes: 0 %
Lymphocytes Absolute: 2 10*3/uL (ref 0.7–3.1)
Lymphs: 28 %
MCH: 31.8 pg (ref 26.6–33.0)
MCHC: 34.8 g/dL (ref 31.5–35.7)
MCV: 91 fL (ref 79–97)
Monocytes Absolute: 0.6 10*3/uL (ref 0.1–0.9)
Monocytes: 9 %
Neutrophils Absolute: 4.2 10*3/uL (ref 1.4–7.0)
Neutrophils: 59 %
Platelets: 259 10*3/uL (ref 150–450)
RBC: 5.29 x10E6/uL (ref 4.14–5.80)
RDW: 12.7 % (ref 11.6–15.4)
WBC: 7 10*3/uL (ref 3.4–10.8)

## 2018-12-21 LAB — COMPREHENSIVE METABOLIC PANEL
ALT: 21 IU/L (ref 0–44)
AST: 19 IU/L (ref 0–40)
Albumin/Globulin Ratio: 1.9 (ref 1.2–2.2)
Albumin: 4.4 g/dL (ref 3.8–4.8)
Alkaline Phosphatase: 78 IU/L (ref 39–117)
BUN/Creatinine Ratio: 17 (ref 10–24)
BUN: 13 mg/dL (ref 8–27)
Bilirubin Total: 0.5 mg/dL (ref 0.0–1.2)
CO2: 25 mmol/L (ref 20–29)
Calcium: 9.4 mg/dL (ref 8.6–10.2)
Chloride: 104 mmol/L (ref 96–106)
Creatinine, Ser: 0.76 mg/dL (ref 0.76–1.27)
GFR calc Af Amer: 109 mL/min/{1.73_m2} (ref >59)
GFR calc non Af Amer: 94 mL/min/{1.73_m2} (ref >59)
Globulin, Total: 2.3 g/dL (ref 1.5–4.5)
Glucose: 101 mg/dL — ABNORMAL HIGH (ref 65–99)
Potassium: 4 mmol/L (ref 3.5–5.2)
Sodium: 143 mmol/L (ref 134–144)
Total Protein: 6.7 g/dL (ref 6.0–8.5)

## 2018-12-21 LAB — LIPID PANEL
Chol/HDL Ratio: 3.3 ratio (ref 0.0–5.0)
Cholesterol, Total: 139 mg/dL (ref 100–199)
HDL: 42 mg/dL (ref >39)
LDL Calculated: 67 mg/dL (ref 0–99)
Triglycerides: 151 mg/dL — ABNORMAL HIGH (ref 0–149)
VLDL Cholesterol Cal: 30 mg/dL (ref 5–40)

## 2018-12-21 LAB — PSA: Prostate Specific Ag, Serum: 2.5 ng/mL (ref 0.0–4.0)

## 2018-12-21 MED ORDER — ATORVASTATIN CALCIUM 20 MG PO TABS: 20.0000 mg | ORAL_TABLET | Freq: Every day | ORAL | 3 refills | Status: DC

## 2019-01-27 IMAGING — CT CT ABD-PELV W/O CM
1 of 2 series · 15 of 32 positions shown, 19 images · non-contrast
Comparison: None.

CLINICAL DATA: Left-sided flank pain. Microscopic hematuria.
Diarrhea. Hyperlipidemia. Obesity.

EXAM:
CT ABDOMEN AND PELVIS WITHOUT CONTRAST
TECHNIQUE: Multidetector CT imaging of the abdomen and pelvis was performed
following the standard protocol without IV contrast.

[Series 2: renal standard/full · axial · 0.77mm/px · z∈[-479,-9]mm · 15 of 104 slices shown, 19 images]
[im 5/104  soft-tissue]
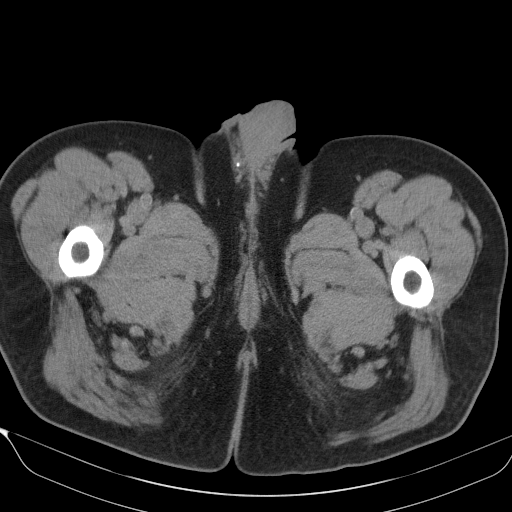
[im 5/104  bone]
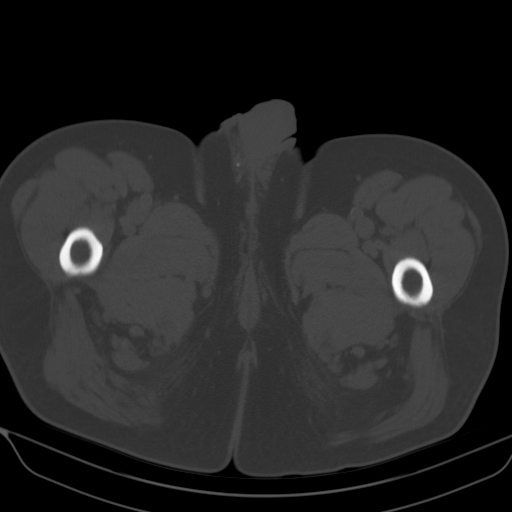
[im 13/104  soft-tissue]
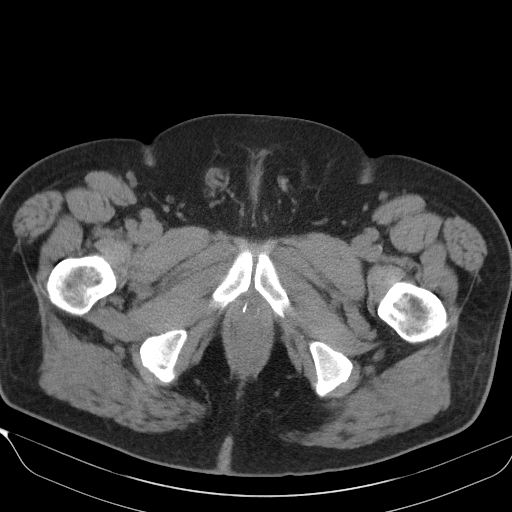
[im 21/104  soft-tissue]
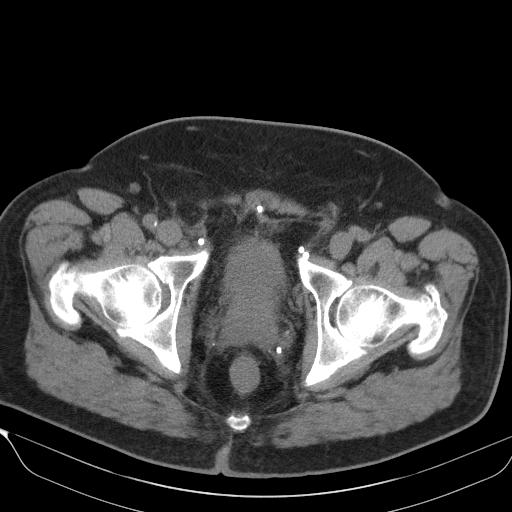
[im 29/104  soft-tissue]
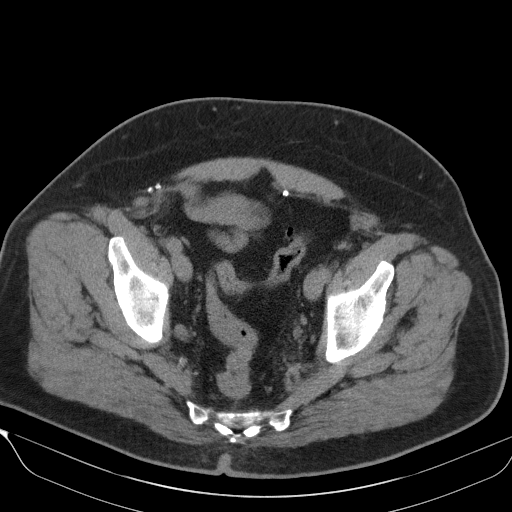
[im 38/104  soft-tissue]
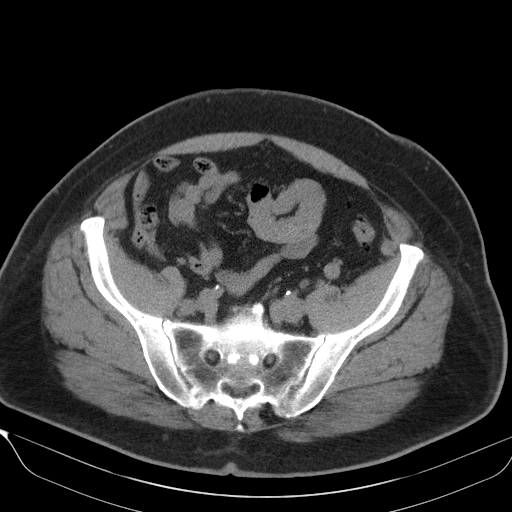
[im 46/104  soft-tissue]
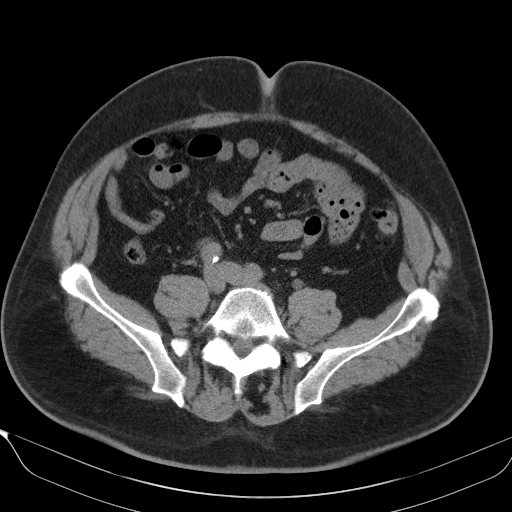
[im 54/104  soft-tissue]
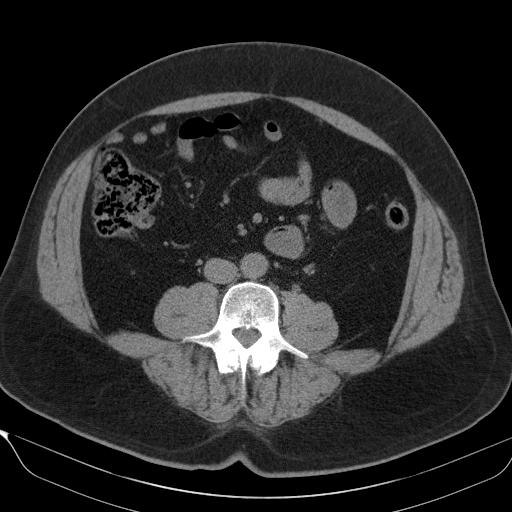
[im 58/104  soft-tissue]
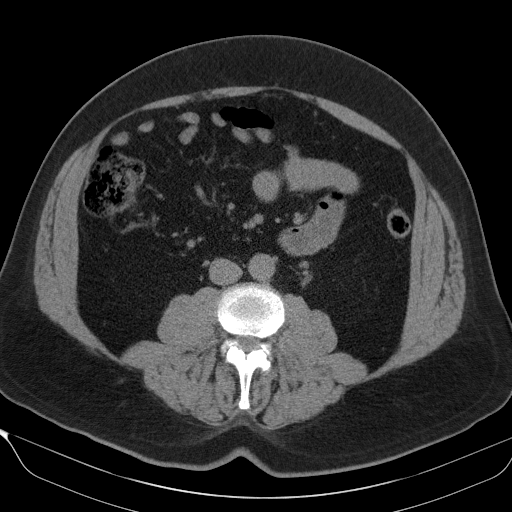
[im 66/104  soft-tissue]
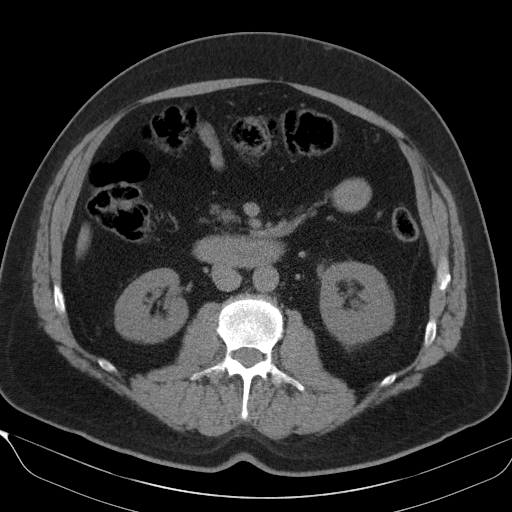
[im 66/104  bone]
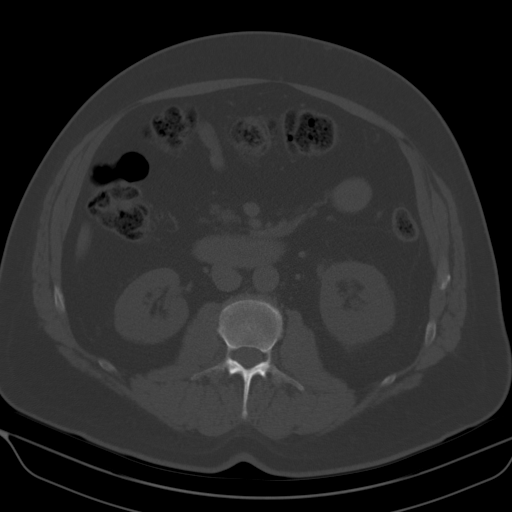
[im 75/104  soft-tissue]
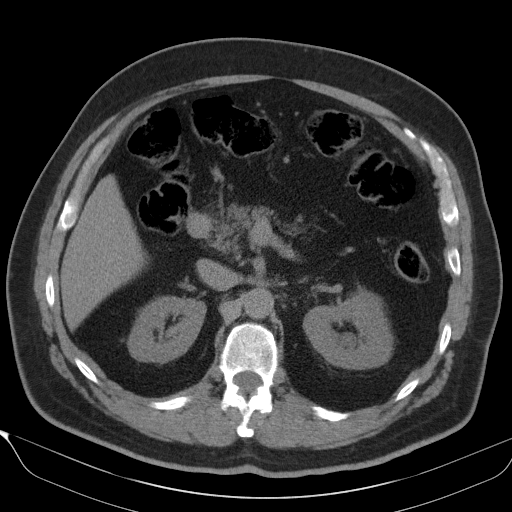
[im 83/104  soft-tissue]
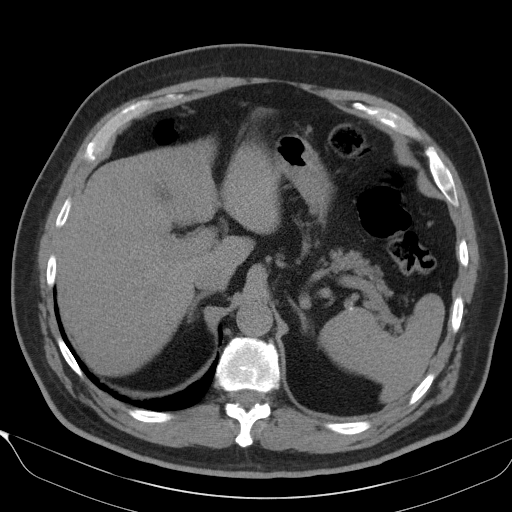
[im 87/104  lung]
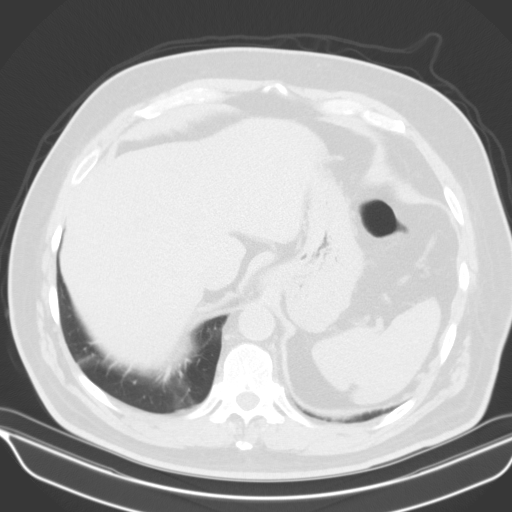
[im 91/104  soft-tissue]
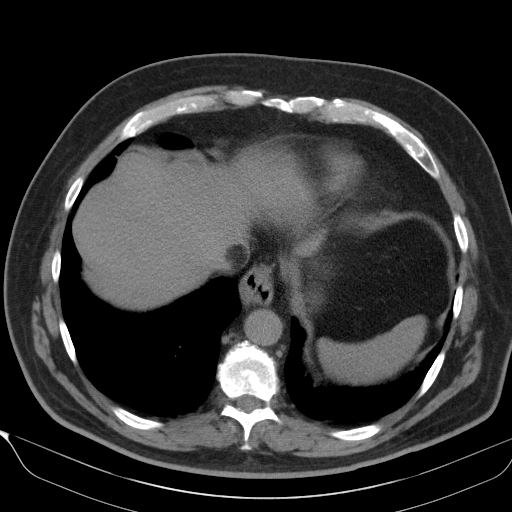
[im 91/104  lung]
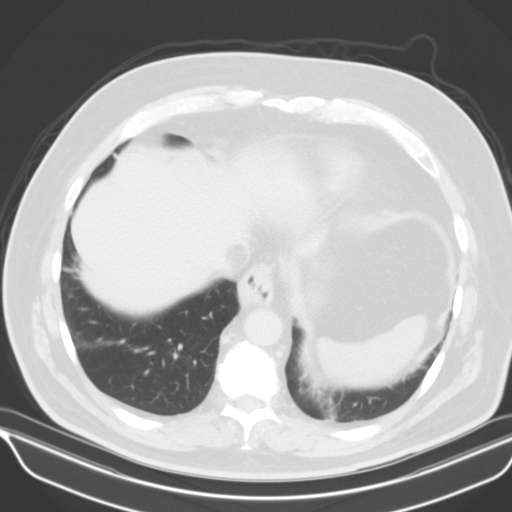
[im 95/104  lung]
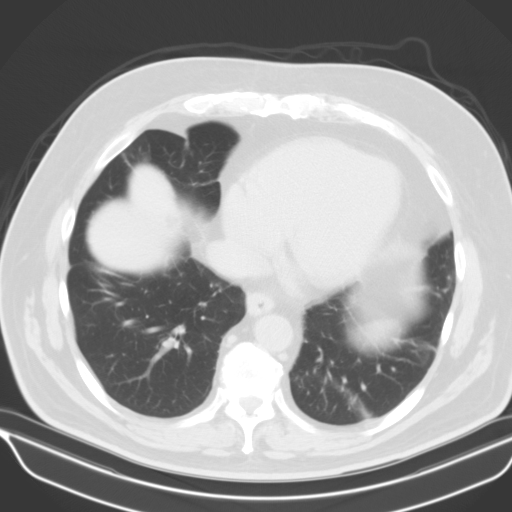
[im 99/104  soft-tissue]
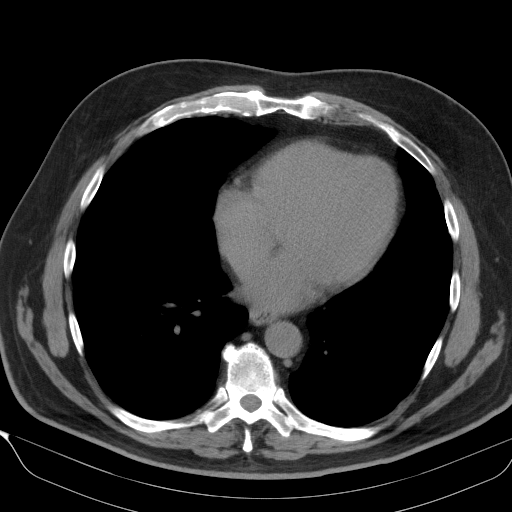
[im 99/104  lung]
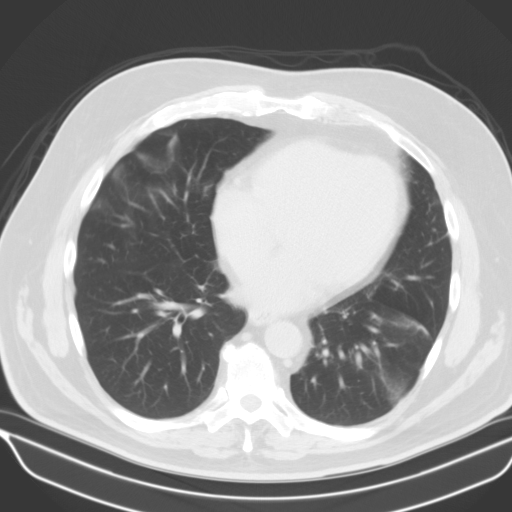

[15 of 32 positions shown; findings below may reference images not displayed]

FINDINGS: Lower chest: Bibasilar subsegmental atelectasis or scar. Normal
heart size without pericardial or pleural effusion. Tiny hiatal
hernia.

Hepatobiliary: Normal liver. Normal gallbladder, without biliary
ductal dilatation.

Pancreas: Normal, without mass or ductal dilatation.

Spleen: Normal in size, without focal abnormality.

Adrenals/Urinary Tract: Normal adrenal glands. Normal right kidney.
No renal calculi. Mild left-sided hydroureteronephrosis to the level
of the distal left ureteric 3-4 mm calculus on image 80/series 2. No
bladder calculi.

Stomach/Bowel: Normal remainder of the stomach. Scattered colonic
diverticula. Normal terminal ileum and appendix. Normal small bowel.

Vascular/Lymphatic: Aortic and branch vessel atherosclerosis. No
abdominopelvic adenopathy.

Reproductive: Normal prostate.

Other: Bilateral inguinal hernia repair. Recurrent or residual fat
containing right inguinal hernia. No significant free fluid.

Musculoskeletal: No acute osseous abnormality.
IMPRESSION: 1. Distal left ureteric stone with mild secondary
hydroureteronephrosis.
2.  Tiny hiatal hernia.
3. Recurrent or residual fat containing right inguinal hernia,
status post repair.
4.  Aortic Atherosclerosis (OKSAS-IY0.0).

## 2019-03-20 ENCOUNTER — Other Ambulatory Visit: Payer: Self-pay

## 2019-03-20 ENCOUNTER — Encounter: Payer: Self-pay | Admitting: Family Medicine

## 2019-03-20 ENCOUNTER — Ambulatory Visit: Payer: BC Managed Care – PPO | Admitting: Family Medicine

## 2019-03-20 VITALS — BP 126/84 | HR 88 | Temp 97.8°F | Wt 247.8 lb

## 2019-03-20 DIAGNOSIS — L03115 Cellulitis of right lower limb: Secondary | ICD-10-CM | POA: Diagnosis not present

## 2019-03-20 MED ORDER — DOXYCYCLINE HYCLATE 100 MG PO TABS: 100.0000 mg | ORAL_TABLET | Freq: Two times a day (BID) | ORAL | 0 refills | Status: DC

## 2019-03-20 NOTE — Progress Notes (Signed)
   Subjective:    Patient ID: Adam Rosales, male    DOB: [birth year], 68 y.o.   MRN: [id number]  HPI 4 days ago he noted a slight bump on the right lateral calf area.  He has had intermittent difficulty until yesterday when he noted more redness swelling and discomfort.   Review of Systems     Objective:   Physical Exam Exam of the right lateral calf shows a 6 to 7 cm red warm tender area.  A picture was taken on his phone and the edges demarcated with ink.       Assessment & Plan:  Cellulitis of right lower extremity - Plan: doxycycline (VIBRA-TABS) 100 MG tablet He was started on this today.  Cautioned to call if any questions.  With the holiday coming up, if this gets worse he will need to go to an urgent care center expressed understanding of this.

## 2019-03-28 ENCOUNTER — Telehealth: Payer: Self-pay | Admitting: Family Medicine

## 2019-03-28 DIAGNOSIS — L03115 Cellulitis of right lower limb: Secondary | ICD-10-CM

## 2019-03-28 MED ORDER — DOXYCYCLINE HYCLATE 100 MG PO TABS: 100.0000 mg | ORAL_TABLET | Freq: Two times a day (BID) | ORAL | 0 refills | Status: DC

## 2019-03-28 NOTE — Telephone Encounter (Signed)
I called it in 

## 2019-03-28 NOTE — Telephone Encounter (Signed)
Pt called and states that he is almost out of the medication he was given. He states that he is feeling better but still not 100%. He would like another round of med. Pt uses [facility] on [address] and can be reached at [phone].

## 2019-03-28 NOTE — Telephone Encounter (Signed)
LVM advise pt Dr. Tonita Cong in med . [place]

## 2019-04-11 ENCOUNTER — Ambulatory Visit: Payer: BC Managed Care – PPO | Admitting: Medical

## 2019-04-11 ENCOUNTER — Other Ambulatory Visit: Payer: Self-pay

## 2019-04-11 ENCOUNTER — Encounter: Payer: Self-pay | Admitting: Medical

## 2019-04-11 VITALS — BP 144/88 | HR 63 | Ht 71.0 in | Wt 244.6 lb

## 2019-04-11 DIAGNOSIS — R079 Chest pain, unspecified: Secondary | ICD-10-CM

## 2019-04-11 DIAGNOSIS — G479 Sleep disorder, unspecified: Secondary | ICD-10-CM

## 2019-04-11 DIAGNOSIS — S29011A Strain of muscle and tendon of front wall of thorax, initial encounter: Secondary | ICD-10-CM

## 2019-04-11 DIAGNOSIS — F419 Anxiety disorder, unspecified: Secondary | ICD-10-CM

## 2019-04-11 DIAGNOSIS — R0789 Other chest pain: Secondary | ICD-10-CM | POA: Diagnosis not present

## 2019-04-11 NOTE — Progress Notes (Signed)
Subjective: Chief Complaint  Patient presents with  . Chest Pain    mid chest area 3-4 days    Here today for chest pain.  He notes for the last 3 to 4 days having a pain in his right upper chest/breast.  He was moving furniture before this started a few days ago, including a small dresser and some mattresses.  So he attributes the pain likely to a pulled muscle.  He does have some pain when he bends over or when he is leaning back or tying his shoes.  The pain is relatively constant and does occur with motion.  He has not taken anything for the pain.  The pain has not really worsen or resolved.  However given his age his wife encouraged him to come in to have an EKG just to make sure there was nothing going on with his heart.  He denies any chest pain with radiation to the jaw or left arm.  No associated shortness of breath, dizziness, nausea, sweats, shortness of breath, tingling.  He has not been having palpitations or fatigue or dyspnea on exertion.  He has been in usual state of health in general.  He does note some sleep issues, been having some anxiety in general causing him to have difficulties with sleep.  He sleeps in a different room from his wife as he gets up several times a night due to anxiety and worry.  He finds it watching TV helps him get back to sleep.  Since this is disruptive to his wife sleep she sleeps in a different room.  There has not been concerns with snoring or sleep apnea.  His brother does have a history of sleep apnea.  No other aggravating or relieving factors. No other complaint.  Past Medical History:  Diagnosis Date  . Dyslipidemia   . Hyperlipidemia   . Kidney stones    Current Outpatient Medications on File Prior to Visit  Medication Sig Dispense Refill  . atorvastatin (LIPITOR) 20 MG tablet Take 1 tablet (20 mg total) by mouth daily. 90 tablet 3  . fluoruracil (CARAC) 0.5 % cream Apply topically daily.    Marland Kitchen loperamide (IMODIUM A-D) 2 MG tablet Take 2 mg  by mouth 4 (four) times daily as needed for diarrhea or loose stools.    . Multiple Vitamin (MULTIVITAMIN) tablet Take 1 tablet by mouth daily.    Marland Kitchen amoxicillin-clavulanate (AUGMENTIN) 875-125 MG tablet Take 1 tablet by mouth 2 (two) times daily. (Patient not taking: Reported on 12/20/2018) 20 tablet 0  . aspirin EC 81 MG tablet Take 81 mg by mouth daily.    Marland Kitchen doxycycline (VIBRA-TABS) 100 MG tablet Take 1 tablet (100 mg total) by mouth 2 (two) times daily. (Patient not taking: Reported on 04/11/2019) 20 tablet 0   No current facility-administered medications on file prior to visit.   ROS as in subjective   Objective: BP (!) 144/88   Pulse 63   Ht 5\' 11"  (1.803 m)   Wt 244 lb 9.6 oz (110.9 kg)   SpO2 98%   BMI 34.11 kg/m   Wt Readings from Last 3 Encounters:  04/11/19 244 lb 9.6 oz (110.9 kg)  03/20/19 247 lb 12.8 oz (112.4 kg)  12/20/18 242 lb 9.6 oz (110 kg)   BP Readings from Last 3 Encounters:  04/11/19 (!) 144/88  03/20/19 126/84  12/20/18 124/82   Gen: wd, wn, nad, white male Skin: No bruising no erythema not diaphoretic Lungs clear no wheezing  no rhonchi no rales Heart: RRR, normal S1-S2 no murmurs He is tender over the right breast mildly with palpation but no swelling no lump but no pain with chest or arm range of motion, no spasm, no deformity The rest of the chest unremarkable Arms nontender, normal range of motion no deformity or swelling Arms neurovascularly intact Abdomen nontender no mass no organomegaly No lower extremity edema Pulses 2+ throughout Psych: pleasant, good eye contact, answers questions appropriately  EKG Indication chest pain Rate 67 bpm, PR interval 166 ms, QRS 110 ms, QTC 441 ms, axis -25 degrees, normal sinus rhythm, no acute change compared to 2015 EKG    Assessment: Encounter Diagnoses  Name Primary?  . Chest pain, unspecified type Yes  . Chest wall pain   . Muscle strain of chest wall, initial encounter   . Sleep disturbance    . Anxiety      Plan: Chest pain-reviewed EKG, discussed symptoms that suggest musculoskeletal chest wall pain from recent lifting of furniture.  I reviewed recent CMET , CBC and Lipid labs from 11/2018.  Of note, he has aortic atherosclerosis from 12/2016 CT abdomen/pelvis.   He is compliant with statin  Chest wall pain, muscle strain-advised stretching, heat, relative rest, can use over-the-counter Aleve twice daily for the next 3 to 5 days.  Call return if not resolving within the next week  Sleep disturbance, anxiety-consider counseling, discussed importance of exercise, consider exercising with a friend a few days per week, consider journaling.   He will consider trying over-the-counter melatonin daily at bedtime, discussed sleep hygiene in general.   Follow-up with PCP here Dr. Cecil Cranker was seen today for chest pain.  Diagnoses and all orders for this visit:  Chest pain, unspecified type -     EKG 12-Lead  Chest wall pain  Muscle strain of chest wall, initial encounter  Sleep disturbance -     EKG 12-Lead  Anxiety -     EKG 12-Lead

## 2019-10-05 ENCOUNTER — Ambulatory Visit: Payer: BC Managed Care – PPO | Admitting: Family Medicine

## 2019-10-05 ENCOUNTER — Encounter: Payer: Self-pay | Admitting: Family Medicine

## 2019-10-05 ENCOUNTER — Other Ambulatory Visit: Payer: Self-pay

## 2019-10-05 VITALS — BP 138/84 | HR 87 | Temp 96.8°F | Wt 253.2 lb

## 2019-10-05 DIAGNOSIS — H60331 Swimmer's ear, right ear: Secondary | ICD-10-CM

## 2019-10-05 MED ORDER — NEOMYCIN-POLYMYXIN-HC 3.5-10000-1 OT SUSP: 3.0000 [drp] | Freq: Three times a day (TID) | OTIC | 0 refills | Status: DC

## 2019-10-05 NOTE — Progress Notes (Signed)
   Subjective:    Patient ID: Adam Rosales, male    DOB: 04/29/1950, 69 y.o.   MRN: [id number]  HPI He has a several day history of right earache but no fever, chills, sore throat, drainage from the ear.   Review of Systems     Objective:   Physical Exam Alert and in no distress.  Left TM and canal are normal.  Right canal was slightly erythematous and narrowed, TM appeared normal.  Neck is supple without adenopathy.  Throat clear.       Assessment & Plan:  Acute swimmer's ear of right side - Plan: neomycin-polymyxin-hydrocortisone (CORTISPORIN) 3.5-10000-1 OTIC suspension He will call if further trouble.

## 2019-11-19 ENCOUNTER — Encounter: Payer: Self-pay | Admitting: Family Medicine

## 2019-12-19 ENCOUNTER — Ambulatory Visit: Payer: BC Managed Care – PPO | Admitting: Family Medicine

## 2019-12-19 ENCOUNTER — Encounter: Payer: BC Managed Care – PPO | Admitting: Family Medicine

## 2019-12-19 ENCOUNTER — Encounter: Payer: Self-pay | Admitting: Family Medicine

## 2019-12-19 ENCOUNTER — Other Ambulatory Visit: Payer: Self-pay

## 2019-12-19 VITALS — BP 134/88 | HR 69 | Temp 97.8°F | Ht 71.0 in | Wt 245.8 lb

## 2019-12-19 DIAGNOSIS — R739 Hyperglycemia, unspecified: Secondary | ICD-10-CM

## 2019-12-19 DIAGNOSIS — E785 Hyperlipidemia, unspecified: Secondary | ICD-10-CM

## 2019-12-19 DIAGNOSIS — Z0000 Encounter for general adult medical examination without abnormal findings: Secondary | ICD-10-CM

## 2019-12-19 DIAGNOSIS — Z8042 Family history of malignant neoplasm of prostate: Secondary | ICD-10-CM

## 2019-12-19 DIAGNOSIS — E669 Obesity, unspecified: Secondary | ICD-10-CM | POA: Diagnosis not present

## 2019-12-19 DIAGNOSIS — G479 Sleep disorder, unspecified: Secondary | ICD-10-CM

## 2019-12-19 DIAGNOSIS — N200 Calculus of kidney: Secondary | ICD-10-CM

## 2019-12-19 DIAGNOSIS — I700 Atherosclerosis of aorta: Secondary | ICD-10-CM | POA: Diagnosis not present

## 2019-12-19 DIAGNOSIS — Z86010 Personal history of colonic polyps: Secondary | ICD-10-CM

## 2019-12-19 MED ORDER — ATORVASTATIN CALCIUM 20 MG PO TABS: 20.0000 mg | ORAL_TABLET | Freq: Every day | ORAL | 3 refills | Status: DC

## 2019-12-19 NOTE — Progress Notes (Signed)
   Subjective:    Patient ID: Adam Rosales, male    DOB: [birth year], 69 y.o.   MRN: [id number]  HPI He is here for complete examination.  He is taking atorvastatin and is having no difficulty with that. He does have a history of atherosclerosis  He also has a history of colonic polyps Is scheduled for follow-up in 2023.  He also has a family history of prostate cancer.  In the past he has had renal stones but none recently.  He does complain of some slight issue with sleep disturbance.  He continues to work.  His home life is going well.  Family and social history as well as health maintenance and immunizations was reviewed.  His exercise is minimal.  Review of Systems  All other systems reviewed and are negative.      Objective:   Physical Exam  Alert and in no distress. Tympanic membranes and canals are normal. Pharyngeal area is normal. Neck is supple without adenopathy or thyromegaly. Cardiac exam shows a regular sinus rhythm without murmurs or gallops. Lungs are clear to auscultation. Abdominal exam shows no masses or tenderness with normal bowel sounds       Assessment & Plan:  Routine general medical examination at a health care facility - Plan: Comprehensive metabolic panel, CBC with Differential/Platelet, Lipid panel  Hyperlipidemia with target LDL less than 100 - Plan: Lipid panel, atorvastatin (LIPITOR) 20 MG tablet  Family history of prostate cancer - Plan: PSA  Aortic atherosclerosis ([place]) - Plan: Lipid panel  Obesity (BMI 30-39.9) - Plan: Comprehensive metabolic panel, CBC with Differential/Platelet, Lipid panel  Hx of adenomatous colonic polyps  Sleep disturbance  Renal stone  Continue with his present medication regimen as well as OTC meds.  Encouraged regular physical activity.  He will follow up with GI concerning the colonic polyps.  I discussed sleep disturbance with him in terms of writing things down before he goes to bed at night to help clear his mind.   Encouraged him to get the Covid booster.  Lipitor renewed.  He will call if he has any trouble indicating possible kidney stone.

## 2019-12-20 LAB — COMPREHENSIVE METABOLIC PANEL
ALT: 28 IU/L (ref 0–44)
AST: 25 IU/L (ref 0–40)
Albumin/Globulin Ratio: 2.1 (ref 1.2–2.2)
Albumin: 4.7 g/dL (ref 3.8–4.8)
Alkaline Phosphatase: 71 IU/L (ref 48–121)
BUN/Creatinine Ratio: 11 (ref 10–24)
BUN: 9 mg/dL (ref 8–27)
Bilirubin Total: 0.8 mg/dL (ref 0.0–1.2)
CO2: 24 mmol/L (ref 20–29)
Calcium: 9.1 mg/dL (ref 8.6–10.2)
Chloride: 102 mmol/L (ref 96–106)
Creatinine, Ser: 0.85 mg/dL (ref 0.76–1.27)
GFR calc Af Amer: 103 mL/min/{1.73_m2} (ref >59)
GFR calc non Af Amer: 90 mL/min/{1.73_m2} (ref >59)
Globulin, Total: 2.2 g/dL (ref 1.5–4.5)
Glucose: 108 mg/dL — ABNORMAL HIGH (ref 65–99)
Potassium: 4.2 mmol/L (ref 3.5–5.2)
Sodium: 141 mmol/L (ref 134–144)
Total Protein: 6.9 g/dL (ref 6.0–8.5)

## 2019-12-20 LAB — LIPID PANEL
Chol/HDL Ratio: 3.7 ratio (ref 0.0–5.0)
Cholesterol, Total: 139 mg/dL (ref 100–199)
HDL: 38 mg/dL — ABNORMAL LOW (ref >39)
LDL Chol Calc (NIH): 79 mg/dL (ref 0–99)
Triglycerides: 120 mg/dL (ref 0–149)
VLDL Cholesterol Cal: 22 mg/dL (ref 5–40)

## 2019-12-20 LAB — CBC WITH DIFFERENTIAL/PLATELET
Basophils Absolute: 0.1 10*3/uL (ref 0.0–0.2)
Basos: 1 %
EOS (ABSOLUTE): 0.3 10*3/uL (ref 0.0–0.4)
Eos: 5 %
Hematocrit: 49.9 % (ref 37.5–51.0)
Hemoglobin: 17.3 g/dL (ref 13.0–17.7)
Immature Grans (Abs): 0 10*3/uL (ref 0.0–0.1)
Immature Granulocytes: 0 %
Lymphocytes Absolute: 2.4 10*3/uL (ref 0.7–3.1)
Lymphs: 36 %
MCH: 31.9 pg (ref 26.6–33.0)
MCHC: 34.7 g/dL (ref 31.5–35.7)
MCV: 92 fL (ref 79–97)
Monocytes Absolute: 0.6 10*3/uL (ref 0.1–0.9)
Monocytes: 9 %
Neutrophils Absolute: 3.3 10*3/uL (ref 1.4–7.0)
Neutrophils: 49 %
Platelets: 261 10*3/uL (ref 150–450)
RBC: 5.42 x10E6/uL (ref 4.14–5.80)
RDW: 12.6 % (ref 11.6–15.4)
WBC: 6.7 10*3/uL (ref 3.4–10.8)

## 2019-12-20 LAB — PSA: Prostate Specific Ag, Serum: 3 ng/mL (ref 0.0–4.0)

## 2019-12-28 LAB — SPECIMEN STATUS REPORT

## 2019-12-28 LAB — HGB A1C W/O EAG: Hgb A1c MFr Bld: 5.6 % (ref 4.8–5.6)

## 2020-04-26 DIAGNOSIS — N492 Inflammatory disorders of scrotum: Secondary | ICD-10-CM

## 2020-04-26 DIAGNOSIS — L03317 Cellulitis of buttock: Secondary | ICD-10-CM

## 2020-04-26 HISTORY — DX: Cellulitis of buttock: L03.317

## 2020-04-26 HISTORY — DX: Inflammatory disorders of scrotum: N49.2

## 2020-05-09 ENCOUNTER — Other Ambulatory Visit: Payer: Self-pay

## 2020-05-09 ENCOUNTER — Encounter: Payer: Self-pay | Admitting: Medical

## 2020-05-09 ENCOUNTER — Ambulatory Visit: Payer: BC Managed Care – PPO | Admitting: Medical

## 2020-05-09 ENCOUNTER — Ambulatory Visit: Payer: BC Managed Care – PPO | Admitting: Family Medicine

## 2020-05-09 VITALS — BP 144/90 | HR 65 | Ht 71.0 in | Wt 239.8 lb

## 2020-05-09 DIAGNOSIS — L03211 Cellulitis of face: Secondary | ICD-10-CM | POA: Diagnosis not present

## 2020-05-09 MED ORDER — DOXYCYCLINE HYCLATE 100 MG PO TABS: 100.0000 mg | ORAL_TABLET | Freq: Two times a day (BID) | ORAL | 0 refills | Status: DC

## 2020-05-09 MED ORDER — MUPIROCIN CALCIUM 2 % EX CREA: 1.0000 "application " | TOPICAL_CREAM | Freq: Two times a day (BID) | CUTANEOUS | 0 refills | Status: DC

## 2020-05-09 NOTE — Progress Notes (Signed)
Subjective:  Adam Rosales is a 70 y.o. male who presents for Chief Complaint  Patient presents with  . Nose Problem    Nose inflamed and hurt after covid test was done      Here for possible nose inflammation or issue.  A week ago he did a swab in the left nostril for COVID.  Within the next day he started feeling irritation over the next few days started feeling a mass inside the left nostril and generalized redness and swelling of the nose.  He denies fever, body aches, chills, no current respiratory symptoms.  No other aggravating or relieving factors. No other complaint.  Past Medical History:  Diagnosis Date  . Dyslipidemia   . Hyperlipidemia   . Kidney stones    The following portions of the patient's history were reviewed and updated as appropriate: allergies, current medications, past family history, past medical history, past social history, past surgical history and problem list.  ROS Otherwise as in subjective above    Objective: BP (!) 144/90   Pulse 65   Ht 5\' 11"  (1.803 m)   Wt 239 lb 12.8 oz (108.8 kg)   SpO2 96%   BMI 33.45 kg/m   General appearance: alert, no distress, well developed, well nourished HEENT: normocephalic, sclerae anicteric, conjunctiva pink and moist, TMs pearly,pharynx normal Oral cavity: MMM, no lesions Neck: supple, no lymphadenopathy, no thyromegaly, no masses Nose with obvious erythema and generalized swelling mostly central to left nose, there is swelling and inflammation inside the nose as well, no obvious mass    Assessment: Encounter Diagnosis  Name Primary?  . Cellulitis of face Yes     Plan: We discussed the symptoms and signs suggestive of skin infection of the face.  I wonder about possible MRSA that could have been flared up with the nasal swab.  Begin medications below, warm compresses, Tylenol as needed for pain or swelling.  If worse redness, swelling, pain over the weekend and go to the emergency department or  urgent care.  If not much improved within 3 days then recheck  Adam Rosales was seen today for nose problem.  Diagnoses and all orders for this visit:  Cellulitis of face  Other orders -     doxycycline (VIBRA-TABS) 100 MG tablet; Take 1 tablet (100 mg total) by mouth 2 (two) times daily. -     mupirocin cream (BACTROBAN) 2 %; Apply 1 application topically 2 (two) times daily.    Follow up: prn

## 2020-05-13 ENCOUNTER — Other Ambulatory Visit: Payer: Self-pay | Admitting: Medical

## 2020-05-13 MED ORDER — MUPIROCIN 2 % EX OINT: 1.0000 "application " | TOPICAL_OINTMENT | Freq: Two times a day (BID) | CUTANEOUS | 1 refills | Status: DC

## 2020-06-18 ENCOUNTER — Ambulatory Visit: Payer: BC Managed Care – PPO | Admitting: Family Medicine

## 2020-06-18 ENCOUNTER — Encounter: Payer: Self-pay | Admitting: Family Medicine

## 2020-06-18 ENCOUNTER — Other Ambulatory Visit: Payer: Self-pay

## 2020-06-18 VITALS — BP 124/88 | HR 90 | Temp 97.2°F | Wt 236.8 lb

## 2020-06-18 DIAGNOSIS — L0231 Cutaneous abscess of buttock: Secondary | ICD-10-CM | POA: Diagnosis not present

## 2020-06-18 NOTE — Progress Notes (Signed)
   Subjective:    Patient ID: Adam Rosales, male    DOB: December 12, 1950, 70 y.o.   MRN: [id number]  HPI He states that approximately 3 days ago he noted some swelling has been slowly getting worse over that timeframe and is now very uncomfortable in the left upper gluteal area. He states he has had difficulty with other infections on his body over the last year or so.   Review of Systems     Objective:   Physical Exam Exam of the upper left gluteal cleft shows erythema and induration of about 3 to 4 cm with a slight central dimple.       Assessment & Plan:  Abscess of gluteal region The area was injected with Xylocaine and epinephrine. A 2 cm incision was made. Minimal amount of drainage is noted however probing the area did show a cavitary area. It was probed to ensure no loculated areas. It was then packed with iodoform. He will leave that in for 2 days. He is to then irrigate the area several times per day to help with healing. I then discussed the use of Hibiclens several times per week over his entire body to help with prevention of further abscesses.

## 2020-06-18 NOTE — Patient Instructions (Signed)
Use Hibiclens to your skin several times per week.  When you put it on leave it on for several minutes. Leave the packing in today and tomorrow and you can take it out Friday.  Irrigate the area out several times per day in the shower put a Band-Aid on and use an ointment on that to keep it from drying up

## 2020-06-25 ENCOUNTER — Ambulatory Visit: Payer: BC Managed Care – PPO | Admitting: Medical

## 2020-06-25 ENCOUNTER — Other Ambulatory Visit: Payer: Self-pay

## 2020-06-25 ENCOUNTER — Encounter: Payer: Self-pay | Admitting: Medical

## 2020-06-25 ENCOUNTER — Other Ambulatory Visit: Payer: Self-pay | Admitting: Medical

## 2020-06-25 VITALS — BP 136/92 | HR 85 | Temp 98.2°F | Ht 71.0 in | Wt 238.0 lb

## 2020-06-25 DIAGNOSIS — L0501 Pilonidal cyst with abscess: Secondary | ICD-10-CM

## 2020-06-25 MED ORDER — OXYCODONE-ACETAMINOPHEN 7.5-325 MG PO TABS
1.0000 | ORAL_TABLET | ORAL | 0 refills | Status: DC | PRN
Start: 2020-06-25 — End: 2020-06-25

## 2020-06-25 MED ORDER — SULFAMETHOXAZOLE-TRIMETHOPRIM 800-160 MG PO TABS: 1.0000 | ORAL_TABLET | Freq: Two times a day (BID) | ORAL | 0 refills | Status: DC

## 2020-06-25 NOTE — Progress Notes (Signed)
  Subjective:   Adam Rosales is a 70 y.o. male who presents for recheck on abscess.  He saw Dr. Redmond School a few days ago for the same.  A procedure was done at that time.  He notes that he feels like the area has gotten worse.  There is pain there is some drainage. the packing accidentally came out 2 days ago.  No fever no body aches or chills no nausea vomiting.  No prior similar abscess in the same area.  No other aggravating or relieving factors.  No other c/o.  Past Medical History:  Diagnosis Date  . Dyslipidemia   . Hyperlipidemia   . Kidney stones     Reviewed prior allergies, medications, past medical history, past surgical history.  ROS as in subjective   Objective:   Vitals:   06/25/20 0811  BP: (!) 136/92  Pulse: 85  Temp: 98.2 F (36.8 C)  SpO2: 97%    Gen: wd, wn, nad Left pilonidal and left superior gluteal area with 7 cm area of erythema induration and tenderness, there 2 or 3 small pink/red indurated patches on the right superior gluteal cleft, there is a surgical wound on the left superior gluteal area from recent I&D with purulent discharge   Assessment:     Encounter Diagnosis  Name Primary?  . Pilonidal abscess Yes     Plan:   Discussed examination findings, diagnosis, usual course of illness, and options for therapy discussed. After discussing recommendations, patient agrees to  I&D, oral antibiotics.    Procedure Informed consent obtained.  The area was prepped in the usual manner and the skin overlying the abscess was anesthetized with 4cc of 1% lidocaine with epinephrine.  The area was sharply incised and approx 1ccs of purulent material was obtained.  Area was irrigated with high pressure saline. Packing was inserted. Wound was covered with sterile bandage.    Advised patient to complete the course of oral antibiotics, use warm compresses or heat applied to the area to promote drainage.  Patient Instructions  Abscess of buttock   Begin  Bactrim twice daily for 10 days  Begin pain medicine oxycodone with Tylenol 1 tablet every 4-6 hours as needed  If the pain is not so bad just use over-the-counter Tylenol.  Do not take Tylenol in the oxycodone tablet at the same time, alternate by at least 4 hours  Use heat pad or hot towel over the area when possible as this encourages drainage  Have your wife pulled the packing out on Friday  After the packing is out you can do hot soaks and hot showers, soapy baths  Plan to recheck on Monday.  If way worse over the next few days such as fever, nausea, vomiting, body aches or chills and go to the emergency department    Follow up: Monday in 6 days.  However, if worse signs of infections as discussed (fever, chills, nausea, vomiting, worsening redness, worsening pain), then call or return immediately.

## 2020-06-25 NOTE — Patient Instructions (Signed)
Abscess of buttock   Begin Bactrim twice daily for 10 days  Begin pain medicine oxycodone with Tylenol 1 tablet every 4-6 hours as needed  If the pain is not so bad just use over-the-counter Tylenol.  Do not take Tylenol in the oxycodone tablet at the same time, alternate by at least 4 hours  Use heat pad or hot towel over the area when possible as this encourages drainage  Have your wife pulled the packing out on Friday  After the packing is out you can do hot soaks and hot showers, soapy baths  Plan to recheck on Monday.  If way worse over the next few days such as fever, nausea, vomiting, body aches or chills and go to the emergency department

## 2020-06-30 ENCOUNTER — Other Ambulatory Visit: Payer: Self-pay

## 2020-06-30 ENCOUNTER — Ambulatory Visit: Payer: BC Managed Care – PPO | Admitting: Medical

## 2020-06-30 ENCOUNTER — Encounter: Payer: Self-pay | Admitting: Medical

## 2020-06-30 VITALS — BP 135/80 | HR 76 | Ht 71.0 in | Wt 236.8 lb

## 2020-06-30 DIAGNOSIS — L0501 Pilonidal cyst with abscess: Secondary | ICD-10-CM

## 2020-06-30 DIAGNOSIS — L0231 Cutaneous abscess of buttock: Secondary | ICD-10-CM

## 2020-06-30 MED ORDER — AMOXICILLIN-POT CLAVULANATE 875-125 MG PO TABS: 1.0000 | ORAL_TABLET | Freq: Two times a day (BID) | ORAL | 0 refills | Status: DC

## 2020-06-30 NOTE — Progress Notes (Signed)
  Subjective:   Adam Rosales is a 70 y.o. male who presents for recheck on abscess.  I saw him this past Friday 3 days ago for same.    He saw Dr. Redmond School about a week ago initially for abscess of left buttock/gluteal cleft.  By Friday it was worsening despite I&D including opposite buttock gluteal cleft.  He feels that the left side is slightly improved, but right side is worse with redness, swelling and pain.  No prior similar abscess in the same area.  No fever, no body aches or chills.  No other aggravating or relieving factors.  No other c/o.  Past Medical History:  Diagnosis Date  . Dyslipidemia   . Hyperlipidemia   . Kidney stones     Reviewed prior allergies, medications, past medical history, past surgical history.  ROS as in subjective   Objective:   Vitals:   06/30/20 0806  BP: 135/80  Pulse: 76  SpO2: 98%    Gen: wd, wn, nad Left pilonidal and left superior gluteal area with 5 cm area of erythema induration and tenderness, there two, 3cm diameter areas now on the right with erythema, induration, tendnerss, worse than 3 days ago on right gluteal cleft, there is a surgical wound on the left superior gluteal area from recent I&D with some purulent discharge   Assessment:     Encounter Diagnoses  Name Primary?  Marland Kitchen Abscess of buttock Yes  . Pilonidal abscess      Plan:   Buttock/pilonidal abscess that started about a week ago, despite I&D to the left gluteal cleft, not much improved and right gluteal cleft actually worse with new areas of abscess.   Continue Bactrim started 3 days ago, begin Augmentin today.   Continue warm compresses, good hygiene.  Referral to general surgery for consult.    if worse signs of infections as discussed (fever, chills, nausea, vomiting, worsening redness, worsening pain), then go to the emergency dept.

## 2020-07-01 ENCOUNTER — Ambulatory Visit: Payer: BC Managed Care – PPO | Admitting: Medical

## 2020-07-21 ENCOUNTER — Ambulatory Visit: Payer: BC Managed Care – PPO | Admitting: Family Medicine

## 2020-08-04 ENCOUNTER — Other Ambulatory Visit: Payer: Self-pay

## 2020-08-04 ENCOUNTER — Encounter: Payer: Self-pay | Admitting: Family Medicine

## 2020-08-04 ENCOUNTER — Ambulatory Visit (INDEPENDENT_AMBULATORY_CARE_PROVIDER_SITE_OTHER): Payer: BC Managed Care – PPO | Admitting: Family Medicine

## 2020-08-04 VITALS — Temp 97.9°F | Ht 71.25 in | Wt 237.2 lb

## 2020-08-04 DIAGNOSIS — R42 Dizziness and giddiness: Secondary | ICD-10-CM

## 2020-08-04 DIAGNOSIS — R3589 Other polyuria: Secondary | ICD-10-CM

## 2020-08-04 DIAGNOSIS — N492 Inflammatory disorders of scrotum: Secondary | ICD-10-CM | POA: Diagnosis not present

## 2020-08-04 DIAGNOSIS — Z638 Other specified problems related to primary support group: Secondary | ICD-10-CM

## 2020-08-04 NOTE — Patient Instructions (Signed)
Cut back on fluids of all kinds within several hours of bedtime and make sure you into your bladder.  Keep track of what you are doing during the day in terms of the color of your urine stream because if it is clear you are well-hydrated Check with the psychiatrist concerning options for you and your wife and how to handle your daughter

## 2020-08-04 NOTE — Progress Notes (Signed)
   Subjective:    Patient ID: Adam Rosales, male    DOB: 1950-11-10, 70 y.o.   MRN: [id number]  HPI He is here for multiple issues.  He did have one episode of slight dizziness with diaphoresis and questionable chest tightness.  This occurred last week.  Since then he has not had any of the symptoms.  He did not check his pulse.  Did not have weakness numbness tingling or visual changes. He also complains of polyuria.  He admits to drinking plenty of fluids and presently is drinking a lot of diet Pepsi and in the process of trying to change this over to just water.  He does not complain of decreased stream, hesitancy, incomplete emptying.  He does get up several times at night.  He does state that his urine comes out relatively clear. He also states that he has noted some scrotal swelling that has been slowly getting worse since last Thursday.   Review of Systems     Objective:   Physical Exam Alert and in no distress. Tympanic membranes and canals are normal. Pharyngeal area is normal. Neck is supple without adenopathy or thyromegaly.  No carotid bruit noted.  Cardiac exam shows a regular sinus rhythm without murmurs or gallops. Lungs are clear to auscultation. Scrotal exam does show a 2 cm firm slightly tender lesion in the mid scrotal area.       Assessment & Plan:  Dizziness  Stress due to family tension  Scrotal abscess  Polyuria I discussed the dizziness with him in detail.  He is to pay attention to this as well as other symptoms that are associated with it.  Discussed how to check his pulse.  He will keep me informed concerning this.  At this time I am unsure as to this being anything of major significance. Discussed the tension he is under with his daughter who apparently is bipolar.  Strongly encouraged him to get some counseling to look into options for her care as she is 25.  Explained to him that I am concerned about him becoming and enabler. Recommend he cut back on  his fluids especially at night and continue to make sure that he empties with bladder before going to bed.  He also will be cognizant of his fluid status during the day. Refer to urology for the abscess.

## 2020-08-11 ENCOUNTER — Encounter: Payer: Self-pay | Admitting: Family Medicine

## 2020-08-11 ENCOUNTER — Ambulatory Visit: Payer: BC Managed Care – PPO | Admitting: Family Medicine

## 2020-08-11 VITALS — BP 164/100 | HR 76 | Temp 97.6°F | Ht 70.0 in | Wt 236.2 lb

## 2020-08-11 DIAGNOSIS — E669 Obesity, unspecified: Secondary | ICD-10-CM

## 2020-08-11 DIAGNOSIS — I10 Essential (primary) hypertension: Secondary | ICD-10-CM | POA: Diagnosis not present

## 2020-08-11 MED ORDER — HYDROCHLOROTHIAZIDE 12.5 MG PO CAPS: 12.5000 mg | ORAL_CAPSULE | Freq: Every day | ORAL | 3 refills | Status: DC

## 2020-08-11 NOTE — Progress Notes (Signed)
   Subjective:    Patient ID: Adam Rosales, male    DOB: [birth year], 70 y.o.   MRN: [id number]  HPI He is here for a blood pressure recheck.  He was recently seen in the neurology office and noted to have an elevated blood pressure.  He has concerns over this.  He also has concerns over skin infections and is worried about diabetes.   Review of Systems     Objective:   Physical Exam Alert and in no distress.  Blood pressure over the last couple of years was reviewed. A1c was done December 19, 2019 and was 5.6.       Assessment & Plan:  Essential hypertension - Plan: hydrochlorothiazide (MICROZIDE) 12.5 MG capsule  Obesity (BMI 30-39.9) I explained that looking at his blood pressure over the last year or so, based on new criteria he does qualify and is time to treat. I discussed diet and exercise with him.  He is exercising fairly regularly.  Recommend he check out the DASH diet.  He is to buy blood pressure cuff, return here in 1 month and we will reassess the situation.

## 2020-09-11 ENCOUNTER — Encounter: Payer: Self-pay | Admitting: Family Medicine

## 2020-09-11 ENCOUNTER — Other Ambulatory Visit: Payer: Self-pay

## 2020-09-11 ENCOUNTER — Ambulatory Visit: Payer: BC Managed Care – PPO | Admitting: Family Medicine

## 2020-09-11 VITALS — BP 154/96 | HR 67 | Temp 97.3°F | Ht 71.0 in | Wt 232.0 lb

## 2020-09-11 DIAGNOSIS — L0231 Cutaneous abscess of buttock: Secondary | ICD-10-CM

## 2020-09-11 DIAGNOSIS — L03317 Cellulitis of buttock: Secondary | ICD-10-CM

## 2020-09-11 DIAGNOSIS — I10 Essential (primary) hypertension: Secondary | ICD-10-CM

## 2020-09-11 NOTE — Progress Notes (Signed)
   Subjective:    Patient ID: Adam Rosales, male    DOB: 1951-04-10, 70 y.o.   MRN: [id number]  HPI He is here for recheck.  He does have a blood pressure cuff at home which reads slightly lower than our blood pressure cuff his readings at home have been in the 120 range.  He continues on HCTZ.  He also has had difficulty with abscess and saw urology for that apparently another one was blossoming in his back area near the waist and he was given Septra.  He states that this is much better but not gone completely.   Review of Systems     Objective:   Physical Exam Alert and in no distress.  Blood pressure is recorded       Assessment & Plan:  White coat syndrome with hypertension  Essential hypertension  Cellulitis and abscess of buttock I explained that he has whitecoat hypertension.  He will continue on his present blood pressure medication.  Again reinforced the fact of checking good blood pressure but not as often as he has been. Right now he is not on the antibiotic and as long as the lesion continues to strength, he should be okay however it starts to cause more difficulty he will call me for refill on his Septra.

## 2020-09-23 ENCOUNTER — Other Ambulatory Visit: Payer: Self-pay

## 2020-09-23 ENCOUNTER — Ambulatory Visit: Payer: BC Managed Care – PPO | Admitting: Family Medicine

## 2020-09-23 ENCOUNTER — Encounter: Payer: Self-pay | Admitting: Family Medicine

## 2020-09-23 VITALS — BP 142/90 | HR 73 | Temp 97.5°F | Wt 228.0 lb

## 2020-09-23 DIAGNOSIS — L0501 Pilonidal cyst with abscess: Secondary | ICD-10-CM | POA: Diagnosis not present

## 2020-09-23 MED ORDER — TRAMADOL HCL 50 MG PO TABS: 50.0000 mg | ORAL_TABLET | Freq: Three times a day (TID) | ORAL | 0 refills | Status: AC | PRN

## 2020-09-23 MED ORDER — SULFAMETHOXAZOLE-TRIMETHOPRIM 800-160 MG PO TABS: 1.0000 | ORAL_TABLET | Freq: Two times a day (BID) | ORAL | 0 refills | Status: DC

## 2020-09-23 NOTE — Progress Notes (Signed)
   Subjective:    Patient ID: Adam Rosales, male    DOB: 02/26/1951, 70 y.o.   MRN: [id number]  HPI He is here for evaluation and treatment of another infection.  He did have one on his scrotum and also 1 on the gluteal area.  He was apparently given Septra for this which did help.  He has been using Hibiclens as well.   Review of Systems     Objective:   Physical Exam Alert and complaining of right gluteal cleft pain.  States it makes it difficult for him to sit. He has a 5 cm area of induration with central fluctuance and slightly draining.       Assessment & Plan:  Pilonidal cyst with abscess - Plan: sulfamethoxazole-trimethoprim (BACTRIM DS) 800-160 MG tablet, traMADol (ULTRAM) 50 MG tablet A 3 cm incision was made into the abscess.  Small amount of material was expressed from it.  The wound was cleaned and did have a fairly decent sized cavity but minimal amount of drainage.  It was packed with iodoform.  He is to return here in 2 days for removal. He will continue to use Hibiclens and also mention the use of Clorox half of cup in tub or water once a week.  If continued difficulty I will refer to general surgery as his last one was in the pilonidal area.

## 2020-09-23 NOTE — Patient Instructions (Signed)
Use the Hibiclens regularly for the next several days and you can back off and use it every other day or twice per week.  Weekly half a cup of Clorox in a tub of water and soaking it to help clean your skin.  If this reoccurs I am going to send her to the surgeons

## 2020-09-25 ENCOUNTER — Other Ambulatory Visit: Payer: Self-pay

## 2020-09-25 ENCOUNTER — Ambulatory Visit: Payer: BC Managed Care – PPO | Admitting: Family Medicine

## 2020-09-25 ENCOUNTER — Encounter: Payer: Self-pay | Admitting: Family Medicine

## 2020-09-25 VITALS — BP 130/88 | HR 80 | Temp 96.0°F | Wt 228.4 lb

## 2020-09-25 DIAGNOSIS — L0501 Pilonidal cyst with abscess: Secondary | ICD-10-CM

## 2020-09-25 NOTE — Progress Notes (Signed)
   Subjective:    Patient ID: Adam Rosales, male    DOB: 12-01-1950, 70 y.o.   MRN: [id number]  HPI He is here for a recheck.  He states that the packing did fall out earlier this morning.  He does note an improvement in his swelling and pain.   Review of Systems     Objective:   Physical Exam Exam of the gluteal area does show minimal drainage.  The induration is essentially unchanged but is nontender.       Assessment & Plan:  Pilonidal cyst with abscess Recommend irrigating this area is much as possible and use all of the antibiotic.  Discussed the possibility of using even more antibiotics since this is becoming more of a reoccurrence.  Recommend Hibiclens for the skin.  We will keep close follow-up and renew the antibiotic if needed.

## 2020-10-02 ENCOUNTER — Telehealth: Payer: Self-pay | Admitting: Family Medicine

## 2020-10-02 DIAGNOSIS — L0501 Pilonidal cyst with abscess: Secondary | ICD-10-CM

## 2020-10-02 MED ORDER — SULFAMETHOXAZOLE-TRIMETHOPRIM 800-160 MG PO TABS: 1.0000 | ORAL_TABLET | Freq: Two times a day (BID) | ORAL | 0 refills | Status: DC

## 2020-10-02 NOTE — Telephone Encounter (Signed)
Pt called and states that he would like another round of  bactrim  States it has not cleared up a 100 percent,   Pt uses [facility] [place] [address]

## 2020-10-02 NOTE — Addendum Note (Signed)
Addended by: Denita Lung on: 10/02/2020 02:20 PM   Modules accepted: Orders

## 2020-11-06 ENCOUNTER — Ambulatory Visit: Payer: BC Managed Care – PPO | Admitting: Medical

## 2020-11-06 ENCOUNTER — Other Ambulatory Visit: Payer: Self-pay

## 2020-11-06 VITALS — BP 132/88 | HR 69 | Temp 97.2°F | Wt 216.2 lb

## 2020-11-06 DIAGNOSIS — Q560 Hermaphroditism, not elsewhere classified: Secondary | ICD-10-CM | POA: Insufficient documentation

## 2020-11-06 DIAGNOSIS — R109 Unspecified abdominal pain: Secondary | ICD-10-CM | POA: Insufficient documentation

## 2020-11-06 DIAGNOSIS — K4091 Unilateral inguinal hernia, without obstruction or gangrene, recurrent: Secondary | ICD-10-CM | POA: Diagnosis not present

## 2020-11-06 DIAGNOSIS — I700 Atherosclerosis of aorta: Secondary | ICD-10-CM | POA: Diagnosis not present

## 2020-11-06 DIAGNOSIS — R350 Frequency of micturition: Secondary | ICD-10-CM

## 2020-11-06 DIAGNOSIS — N401 Enlarged prostate with lower urinary tract symptoms: Secondary | ICD-10-CM | POA: Diagnosis not present

## 2020-11-06 DIAGNOSIS — K5900 Constipation, unspecified: Secondary | ICD-10-CM

## 2020-11-06 LAB — POCT URINALYSIS DIP (PROADVANTAGE DEVICE)
Bilirubin, UA: NEGATIVE
Blood, UA: NEGATIVE
Glucose, UA: NEGATIVE mg/dL
Ketones, POC UA: NEGATIVE mg/dL
Leukocytes, UA: NEGATIVE
Nitrite, UA: NEGATIVE
Protein Ur, POC: NEGATIVE mg/dL
Specific Gravity, Urine: 1.01
Urobilinogen, Ur: 0.2
pH, UA: 6 (ref 5.0–8.0)

## 2020-11-06 MED ORDER — TAMSULOSIN HCL 0.4 MG PO CAPS: 0.4000 mg | ORAL_CAPSULE | Freq: Every day | ORAL | 3 refills | Status: DC

## 2020-11-06 NOTE — Progress Notes (Signed)
Subjective:  Adam Rosales is a 70 y.o. male who presents for Chief Complaint  Patient presents with   lower left Quad    Discomfort. Started a two weeks ago no injury     Here for right lower abdominal discomfort  x 2 weeks.  Walks several miles daily, some weight bearing exercise.  In past 2 weeks notices a fullness in right lower abdomen/inguinal region.   In the mornings fine, but with or after exercise getting some discomfort.  He does have hx/o left inguinal hernia in the past, had that repaired.  This discomfort feels similar to the prior hernia.   No fever, no body aches, no NVD, no appetite change, no diarrhea, maybe sometimes mild constipation.   No blood in stool or urine.   Does have some urinary frequency though x 1 year.   Does get up some in the night to urinate, usually 2 times per night.  No other urine concerns.     No other aggravating or relieving factors.    No other c/o.  Past Medical History:  Diagnosis Date   Dyslipidemia    Hyperlipidemia    Kidney stones    Current Outpatient Medications on File Prior to Visit  Medication Sig Dispense Refill   aspirin EC 81 MG tablet Take 81 mg by mouth daily.     atorvastatin (LIPITOR) 20 MG tablet Take 1 tablet (20 mg total) by mouth daily. 90 tablet 3   hydrochlorothiazide (MICROZIDE) 12.5 MG capsule Take 1 capsule (12.5 mg total) by mouth daily. 90 capsule 3   Multiple Vitamin (MULTIVITAMIN) tablet Take 1 tablet by mouth daily.     No current facility-administered medications on file prior to visit.     The following portions of the patient's history were reviewed and updated as appropriate: allergies, current medications, past family history, past medical history, past social history, past surgical history and problem list.  ROS Otherwise as in subjective above    Objective: BP 132/88   Pulse 69   Temp (!) 97.2 F (36.2 C)   Wt 216 lb 3.2 oz (98.1 kg)   SpO2 97%   BMI 30.15 kg/m   General appearance:  alert, no distress, well developed, well nourished Lungs clear Heart rrr, normal s1, s2, no murmurs Abdomen: +bs, soft, somewhat of a pannus noted, non tender, non distended, no masses, no hepatomegaly, no splenomegaly GU: normal male GU, tender in right inguinal region, a bulge is felt with valsalva but not all the way down into inguinal canal, no other worrisome findings  Pulses: 2+ radial pulses, 2+ pedal pulses, normal cap refill Ext: no edema     Assessment: Encounter Diagnoses  Name Primary?   Right sided abdominal pain Yes   Benign prostatic hyperplasia with urinary frequency    Unilateral recurrent inguinal hernia without obstruction or gangrene    Aortic atherosclerosis (HCC)    Urinary frequency    Constipation, unspecified constipation type      Plan: Right-sided abdominal pain-discussed the differential.  I reviewed a CT scan abd/pelvis from 2018 showing a recurrent hernia on the right inguinal area.  It would seem that this is gotten bigger and probably the source of his pain based on bulge on exam.  He had some other abnormalities on exam at that time.  We will repeat CT scan with likely referral to general surgery for hernia  BPH with urinary symptoms-urinalysis today.  Begin Flomax for likely worsening BPH symptoms  Urinary frequency-urinalysis  labs today  Aortic atherosclerosis-continue statin, follow-up soon for physical as scheduled  Constipation-increase water intake, get good fiber in the diet, consider medication if needed  Story was seen today for lower left quad.  Diagnoses and all orders for this visit:  Right sided abdominal pain -     CT Abdomen Pelvis Wo Contrast; Future -     POCT Urinalysis DIP (Proadvantage Device)  Benign prostatic hyperplasia with urinary frequency -     CT Abdomen Pelvis Wo Contrast; Future  Unilateral recurrent inguinal hernia without obstruction or gangrene -     CT Abdomen Pelvis Wo Contrast; Future  Aortic  atherosclerosis (HCC) -     CT Abdomen Pelvis Wo Contrast; Future  Urinary frequency -     CT Abdomen Pelvis Wo Contrast; Future -     POCT Urinalysis DIP (Proadvantage Device)  Constipation, unspecified constipation type -     CT Abdomen Pelvis Wo Contrast; Future  Other orders -     tamsulosin (FLOMAX) 0.4 MG CAPS capsule; Take 1 capsule (0.4 mg total) by mouth daily.   Follow up: pending scan

## 2020-11-13 ENCOUNTER — Ambulatory Visit: Payer: BC Managed Care – PPO | Admitting: Family Medicine

## 2020-11-13 VITALS — BP 122/80 | HR 67 | Temp 98.5°F | Wt 214.0 lb

## 2020-11-13 DIAGNOSIS — K4090 Unilateral inguinal hernia, without obstruction or gangrene, not specified as recurrent: Secondary | ICD-10-CM

## 2020-11-13 NOTE — Progress Notes (Signed)
   Subjective:    Patient ID: Adam Rosales, male    DOB: Jun 11, 1950, 70 y.o.   MRN: [id number]  HPI He is here for consult concerning continued difficulty with right lower quadrant pain and swelling.  Apparently has gotten worse in the last week or so.   Review of Systems     Objective:   Physical Exam Alert and complaining of right lower quadrant pain.  Rather large inguinal hernia is noted that does enlarged with coughing.       Assessment & Plan:  Right inguinal hernia - Plan: Ambulatory referral to General Surgery

## 2020-11-22 ENCOUNTER — Other Ambulatory Visit: Payer: BC Managed Care – PPO

## 2020-12-11 NOTE — Patient Instructions (Signed)
DUE TO COVID-19 ONLY ONE VISITOR IS ALLOWED TO COME WITH YOU AND STAY IN THE WAITING ROOM ONLY DURING PRE OP AND PROCEDURE DAY OF SURGERY. THE 1 VISITOR  MAY VISIT WITH YOU AFTER SURGERY IN YOUR PRIVATE ROOM DURING VISITING HOURS ONLY!               Adam Rosales   Your procedure is scheduled on: 12/16/20   Report to [facility] Main  Entrance   Report to admitting at : 9:30 AM     Call this number if you have problems the morning of surgery [phone]    Remember: Do not eat solid food :After Midnight. Clear liquids until: 8:30 AM.  CLEAR LIQUID DIET  Foods Allowed                                                                     Foods Excluded  Coffee and tea, regular and decaf                             liquids that you cannot  Plain Jell-O any favor except red or purple                                           see through such as: Fruit ices (not with fruit pulp)                                     milk, soups, orange juice  Iced Popsicles                                    All solid food Carbonated beverages, regular and diet                                    Cranberry, grape and apple juices Sports drinks like Gatorade Lightly seasoned clear broth or consume(fat free) Sugar, honey syrup  Sample Menu Breakfast                                Lunch                                     Supper Cranberry juice                    Beef broth                            Chicken broth Jell-O  Grape juice                           Apple juice Coffee or tea                        Jell-O                                      Popsicle                                                Coffee or tea                        Coffee or tea  _____________________________________________________________________   BRUSH YOUR TEETH MORNING OF SURGERY AND RINSE YOUR MOUTH OUT, NO CHEWING GUM CANDY OR MINTS.    Take these medicines the morning of  surgery with A SIP OF WATER: N/A                               You may not have any metal on your body including hair pins and              piercings  Do not wear jewelry, lotions, powders or perfumes, deodorant             Men may shave face and neck.   Do not bring valuables to the hospital. [place].  Contacts, dentures or bridgework may not be worn into surgery.  Leave suitcase in the car. After surgery it may be brought to your room.     Patients discharged the day of surgery will not be allowed to drive home. IF YOU ARE HAVING SURGERY AND GOING HOME THE SAME DAY, YOU MUST HAVE AN ADULT TO DRIVE YOU HOME AND BE WITH YOU FOR 24 HOURS. YOU MAY GO HOME BY TAXI OR UBER OR ORTHERWISE, BUT AN ADULT MUST ACCOMPANY YOU HOME AND STAY WITH YOU FOR 24 HOURS.  Name and phone number of your driver:  Special Instructions: N/A              Please read over the following fact sheets you were given: _____________________________________________________________________           [facility] - Preparing for Surgery Before surgery, you can play an important role.  Because skin is not sterile, your skin needs to be as free of germs as possible.  You can reduce the number of germs on your skin by washing with CHG (chlorahexidine gluconate) soap before surgery.  CHG is an antiseptic cleaner which kills germs and bonds with the skin to continue killing germs even after washing. Please DO NOT use if you have an allergy to CHG or antibacterial soaps.  If your skin becomes reddened/irritated stop using the CHG and inform your nurse when you arrive at Short Stay. Do not shave (including legs and underarms) for at least 48 hours prior to the first CHG shower.  You may shave your face/neck. Please follow these instructions carefully:  1.  Shower with CHG Soap the night before surgery and the  morning of Surgery.  2.  If you choose to wash your hair, wash your hair  first as usual with your  normal  shampoo.  3.  After you shampoo, rinse your hair and body thoroughly to remove the  shampoo.                           4.  Use CHG as you would any other liquid soap.  You can apply chg directly  to the skin and wash                       Gently with a scrungie or clean washcloth.  5.  Apply the CHG Soap to your body ONLY FROM THE NECK DOWN.   Do not use on face/ open                           Wound or open sores. Avoid contact with eyes, ears mouth and genitals (private parts).                       Wash face,  Genitals (private parts) with your normal soap.             6.  Wash thoroughly, paying special attention to the area where your surgery  will be performed.  7.  Thoroughly rinse your body with warm water from the neck down.  8.  DO NOT shower/wash with your normal soap after using and rinsing off  the CHG Soap.                9.  Pat yourself dry with a clean towel.            10.  Wear clean pajamas.            11.  Place clean sheets on your bed the night of your first shower and do not  sleep with pets. Day of Surgery : Do not apply any lotions/deodorants the morning of surgery.  Please wear clean clothes to the hospital/surgery center.  FAILURE TO FOLLOW THESE INSTRUCTIONS MAY RESULT IN THE CANCELLATION OF YOUR SURGERY PATIENT SIGNATURE_________________________________  NURSE SIGNATURE__________________________________  ________________________________________________________________________

## 2020-12-11 NOTE — Progress Notes (Signed)
Pt. Needs orders for upcoming surgery.PAT and labs on: 12/12/20.

## 2020-12-12 ENCOUNTER — Encounter (HOSPITAL_COMMUNITY)
Admission: RE | Admit: 2020-12-12 | Discharge: 2020-12-12 | Disposition: A | Discharge status: Home / Self Care | Payer: Medicare Other | Source: Ambulatory Visit | Attending: Surgery | Admitting: Surgery

## 2020-12-12 ENCOUNTER — Other Ambulatory Visit: Payer: Self-pay

## 2020-12-12 ENCOUNTER — Encounter (HOSPITAL_COMMUNITY): Payer: Self-pay

## 2020-12-12 DIAGNOSIS — Z01818 Encounter for other preprocedural examination: Secondary | ICD-10-CM | POA: Insufficient documentation

## 2020-12-12 HISTORY — DX: Prediabetes: R73.03

## 2020-12-12 HISTORY — DX: Essential (primary) hypertension: I10

## 2020-12-12 HISTORY — DX: Personal history of urinary calculi: Z87.442

## 2020-12-12 LAB — CBC
HCT: 45.8 % (ref 39.0–52.0)
Hemoglobin: 15.6 g/dL (ref 13.0–17.0)
MCH: 31.5 pg (ref 26.0–34.0)
MCHC: 34.1 g/dL (ref 30.0–36.0)
MCV: 92.5 fL (ref 80.0–100.0)
Platelets: 227 10*3/uL (ref 150–400)
RBC: 4.95 MIL/uL (ref 4.22–5.81)
RDW: 12.6 % (ref 11.5–15.5)
WBC: 4.8 10*3/uL (ref 4.0–10.5)
nRBC: 0 % (ref 0.0–0.2)

## 2020-12-12 LAB — HEMOGLOBIN A1C
Hgb A1c MFr Bld: 5.5 % (ref 4.8–5.6)
Mean Plasma Glucose: 111.15 mg/dL

## 2020-12-12 LAB — BASIC METABOLIC PANEL
Anion gap: 8 (ref 5–15)
BUN: 11 mg/dL (ref 8–23)
CO2: 27 mmol/L (ref 22–32)
Calcium: 9.2 mg/dL (ref 8.9–10.3)
Chloride: 103 mmol/L (ref 98–111)
Creatinine, Ser: 0.75 mg/dL (ref 0.61–1.24)
GFR, Estimated: >60 mL/min (ref >60)
Glucose, Bld: 119 mg/dL — ABNORMAL HIGH (ref 70–99)
Potassium: 4 mmol/L (ref 3.5–5.1)
Sodium: 138 mmol/L (ref 135–145)

## 2020-12-12 NOTE — Progress Notes (Addendum)
COVID Vaccine Completed: Yes Date COVID Vaccine completed: 07/30/20 x 4 COVID vaccine manufacturer: Pfizer     Covid Test: N/A PCP - Dr. Jill Alexanders Cardiologist - No  Chest x-ray -  EKG -  Stress Test -  ECHO -  Cardiac Cath -  Pacemaker/ICD device last checked:  Sleep Study -  CPAP -   Fasting Blood Sugar -  Checks Blood Sugar _____ times a day  Blood Thinner Instructions: Aspirin Instructions: Last Dose:  Anesthesia review:   Patient denies shortness of breath, fever, cough and chest pain at PAT appointment   Patient verbalized understanding of instructions that were given to them at the PAT appointment. Patient was also instructed that they will need to review over the PAT instructions again at home before surgery.

## 2020-12-16 ENCOUNTER — Encounter (HOSPITAL_COMMUNITY): Payer: Self-pay | Admitting: Surgery

## 2020-12-16 ENCOUNTER — Ambulatory Visit (HOSPITAL_COMMUNITY)
Admission: RE | Admit: 2020-12-16 | Discharge: 2020-12-16 | Disposition: A | Discharge status: Home / Self Care | Payer: Medicare Other | Attending: Surgery | Admitting: Surgery

## 2020-12-16 ENCOUNTER — Encounter (HOSPITAL_COMMUNITY)
Admission: RE | Disposition: A | Discharge status: Home / Self Care | Payer: Self-pay | Source: Home / Self Care | Attending: Surgery

## 2020-12-16 ENCOUNTER — Ambulatory Visit (HOSPITAL_COMMUNITY): Payer: Medicare Other | Admitting: Anesthesiology

## 2020-12-16 DIAGNOSIS — Z8042 Family history of malignant neoplasm of prostate: Secondary | ICD-10-CM | POA: Diagnosis not present

## 2020-12-16 DIAGNOSIS — Z79899 Other long term (current) drug therapy: Secondary | ICD-10-CM | POA: Diagnosis not present

## 2020-12-16 DIAGNOSIS — Z8249 Family history of ischemic heart disease and other diseases of the circulatory system: Secondary | ICD-10-CM | POA: Insufficient documentation

## 2020-12-16 DIAGNOSIS — K4091 Unilateral inguinal hernia, without obstruction or gangrene, recurrent: Secondary | ICD-10-CM | POA: Insufficient documentation

## 2020-12-16 DIAGNOSIS — Z791 Long term (current) use of non-steroidal anti-inflammatories (NSAID): Secondary | ICD-10-CM | POA: Diagnosis not present

## 2020-12-16 DIAGNOSIS — Z800 Family history of malignant neoplasm of digestive organs: Secondary | ICD-10-CM | POA: Insufficient documentation

## 2020-12-16 DIAGNOSIS — Z801 Family history of malignant neoplasm of trachea, bronchus and lung: Secondary | ICD-10-CM | POA: Insufficient documentation

## 2020-12-16 DIAGNOSIS — Z7982 Long term (current) use of aspirin: Secondary | ICD-10-CM | POA: Diagnosis not present

## 2020-12-16 HISTORY — PX: INGUINAL HERNIA REPAIR: SHX194

## 2020-12-16 SURGERY — REPAIR, HERNIA, INGUINAL, ADULT
Anesthesia: General | Site: Abdomen | Laterality: Right

## 2020-12-16 MED ORDER — DEXAMETHASONE SODIUM PHOSPHATE 10 MG/ML IJ SOLN
INTRAMUSCULAR | Status: DC | PRN
  Administered 2020-12-16: 10 mg via INTRAVENOUS

## 2020-12-16 MED ORDER — 0.9 % SODIUM CHLORIDE (POUR BTL) OPTIME
TOPICAL | Status: DC | PRN
Start: 2020-12-16 — End: 2020-12-16
  Administered 2020-12-16: 1000 mL

## 2020-12-16 MED ORDER — ORAL CARE MOUTH RINSE: 15.0000 mL | Freq: Once | OROMUCOSAL | Status: AC

## 2020-12-16 MED ORDER — PROPOFOL 10 MG/ML IV BOLUS
INTRAVENOUS | Status: AC
  Filled 2020-12-16: qty 20

## 2020-12-16 MED ORDER — CHLORHEXIDINE GLUCONATE 0.12 % MT SOLN
15.0000 mL | Freq: Once | OROMUCOSAL | Status: AC
  Administered 2020-12-16: 15 mL via OROMUCOSAL

## 2020-12-16 MED ORDER — SUGAMMADEX SODIUM 200 MG/2ML IV SOLN
INTRAVENOUS | Status: DC | PRN
  Administered 2020-12-16: 200 mg via INTRAVENOUS

## 2020-12-16 MED ORDER — MIDAZOLAM HCL 2 MG/2ML IJ SOLN
INTRAMUSCULAR | Status: DC | PRN
  Administered 2020-12-16: 2 mg via INTRAVENOUS

## 2020-12-16 MED ORDER — BUPIVACAINE-EPINEPHRINE 0.25% -1:200000 IJ SOLN
INTRAMUSCULAR | Status: DC | PRN
  Administered 2020-12-16: 30 mL

## 2020-12-16 MED ORDER — FENTANYL CITRATE (PF) 250 MCG/5ML IJ SOLN
INTRAMUSCULAR | Status: DC | PRN
  Administered 2020-12-16 (×2): 50 ug via INTRAVENOUS
  Administered 2020-12-16: 100 ug via INTRAVENOUS
  Administered 2020-12-16: 50 ug via INTRAVENOUS

## 2020-12-16 MED ORDER — DEXAMETHASONE SODIUM PHOSPHATE 10 MG/ML IJ SOLN
INTRAMUSCULAR | Status: AC
  Filled 2020-12-16: qty 1

## 2020-12-16 MED ORDER — ONDANSETRON HCL 4 MG/2ML IJ SOLN
INTRAMUSCULAR | Status: AC
  Filled 2020-12-16: qty 2

## 2020-12-16 MED ORDER — ROCURONIUM BROMIDE 10 MG/ML (PF) SYRINGE
PREFILLED_SYRINGE | INTRAVENOUS | Status: AC
  Filled 2020-12-16: qty 10

## 2020-12-16 MED ORDER — LIDOCAINE 2% (20 MG/ML) 5 ML SYRINGE
INTRAMUSCULAR | Status: DC | PRN
  Administered 2020-12-16: 100 mg via INTRAVENOUS

## 2020-12-16 MED ORDER — ONDANSETRON HCL 4 MG/2ML IJ SOLN
INTRAMUSCULAR | Status: DC | PRN
  Administered 2020-12-16: 4 mg via INTRAVENOUS

## 2020-12-16 MED ORDER — CEFAZOLIN SODIUM-DEXTROSE 2-4 GM/100ML-% IV SOLN
INTRAVENOUS | Status: AC
  Filled 2020-12-16: qty 100

## 2020-12-16 MED ORDER — BUPIVACAINE LIPOSOME 1.3 % IJ SUSP
20.0000 mL | Freq: Once | INTRAMUSCULAR | Status: AC
  Administered 2020-12-16: 20 mL
  Filled 2020-12-16: qty 20

## 2020-12-16 MED ORDER — CEFAZOLIN SODIUM-DEXTROSE 2-3 GM-%(50ML) IV SOLR
INTRAVENOUS | Status: DC | PRN
  Administered 2020-12-16: 2 g via INTRAVENOUS

## 2020-12-16 MED ORDER — ROCURONIUM BROMIDE 10 MG/ML (PF) SYRINGE
PREFILLED_SYRINGE | INTRAVENOUS | Status: DC | PRN
  Administered 2020-12-16: 60 mg via INTRAVENOUS
  Administered 2020-12-16 (×2): 10 mg via INTRAVENOUS

## 2020-12-16 MED ORDER — ACETAMINOPHEN 500 MG PO TABS: 500.0000 mg | ORAL_TABLET | Freq: Four times a day (QID) | ORAL | 0 refills | Status: AC | PRN

## 2020-12-16 MED ORDER — LIDOCAINE 2% (20 MG/ML) 5 ML SYRINGE
INTRAMUSCULAR | Status: AC
  Filled 2020-12-16: qty 5

## 2020-12-16 MED ORDER — PROPOFOL 10 MG/ML IV BOLUS
INTRAVENOUS | Status: DC | PRN
Start: 2020-12-16 — End: 2020-12-16
  Administered 2020-12-16: 110 mg via INTRAVENOUS

## 2020-12-16 MED ORDER — BUPIVACAINE-EPINEPHRINE (PF) 0.25% -1:200000 IJ SOLN
INTRAMUSCULAR | Status: AC
  Filled 2020-12-16: qty 30

## 2020-12-16 MED ORDER — OXYCODONE-ACETAMINOPHEN 5-325 MG PO TABS: 1.0000 | ORAL_TABLET | ORAL | 0 refills | Status: DC | PRN

## 2020-12-16 MED ORDER — LACTATED RINGERS IV SOLN: INTRAVENOUS | Status: DC

## 2020-12-16 MED ORDER — IBUPROFEN 800 MG PO TABS: 800.0000 mg | ORAL_TABLET | Freq: Three times a day (TID) | ORAL | 0 refills | Status: AC

## 2020-12-16 MED ORDER — FENTANYL CITRATE (PF) 250 MCG/5ML IJ SOLN
INTRAMUSCULAR | Status: AC
  Filled 2020-12-16: qty 5

## 2020-12-16 MED ORDER — MIDAZOLAM HCL 2 MG/2ML IJ SOLN
INTRAMUSCULAR | Status: AC
  Filled 2020-12-16: qty 2

## 2020-12-16 SURGICAL SUPPLY — 30 items
BAG COUNTER SPONGE SURGICOUNT (BAG) IMPLANT
BENZOIN TINCTURE PRP APPL 2/3 (GAUZE/BANDAGES/DRESSINGS) IMPLANT
COVER SURGICAL LIGHT HANDLE (MISCELLANEOUS) ×2 IMPLANT
DECANTER SPIKE VIAL GLASS SM (MISCELLANEOUS) IMPLANT
DERMABOND ADVANCED (GAUZE/BANDAGES/DRESSINGS) ×1
DERMABOND ADVANCED .7 DNX12 (GAUZE/BANDAGES/DRESSINGS) ×1 IMPLANT
DRAIN PENROSE 0.5X18 (DRAIN) IMPLANT
DRAPE LAPAROTOMY T 98X78 PEDS (DRAPES) ×2 IMPLANT
DRSG TEGADERM 4X4.75 (GAUZE/BANDAGES/DRESSINGS) IMPLANT
ELECT REM PT RETURN 15FT ADLT (MISCELLANEOUS) ×2 IMPLANT
GLOVE SRG 8 PF TXTR STRL LF DI (GLOVE) ×1 IMPLANT
GLOVE SURG POLY ORTHO LF SZ7.5 (GLOVE) ×2 IMPLANT
GLOVE SURG UNDER POLY LF SZ8 (GLOVE) ×2
GOWN STRL REUS W/TWL XL LVL3 (GOWN DISPOSABLE) ×4 IMPLANT
KIT BASIN OR (CUSTOM PROCEDURE TRAY) ×2 IMPLANT
KIT TURNOVER KIT A (KITS) ×2 IMPLANT
MESH ULTRAPRO 3X6 7.6X15CM (Mesh General) ×2 IMPLANT
NEEDLE HYPO 22GX1.5 SAFETY (NEEDLE) ×2 IMPLANT
PACK GENERAL/GYN (CUSTOM PROCEDURE TRAY) ×2 IMPLANT
STRIP CLOSURE SKIN 1/2X4 (GAUZE/BANDAGES/DRESSINGS) ×2 IMPLANT
SUT ETHIBOND 0 MO6 C/R (SUTURE) ×2 IMPLANT
SUT MNCRL AB 4-0 PS2 18 (SUTURE) ×2 IMPLANT
SUT PROLENE 2 0 CT2 30 (SUTURE) ×4 IMPLANT
SUT VIC AB 2-0 SH 18 (SUTURE) ×2 IMPLANT
SUT VIC AB 2-0 SH 27 (SUTURE) ×2
SUT VIC AB 2-0 SH 27X BRD (SUTURE) ×1 IMPLANT
SUT VIC AB 3-0 SH 18 (SUTURE) ×2 IMPLANT
SYR 20ML LL LF (SYRINGE) ×2 IMPLANT
TOWEL OR 17X26 10 PK STRL BLUE (TOWEL DISPOSABLE) ×2 IMPLANT
TOWEL OR NON WOVEN STRL DISP B (DISPOSABLE) ×2 IMPLANT

## 2020-12-16 NOTE — Anesthesia Procedure Notes (Signed)
Procedure Name: Intubation Date/Time: 12/16/2020 11:53 AM Performed by: Sharlette Dense, CRNA Pre-anesthesia Checklist: Patient identified, Emergency Drugs available, Suction available and Patient being monitored Patient Re-evaluated:Patient Re-evaluated prior to induction Oxygen Delivery Method: Circle system utilized Preoxygenation: Pre-oxygenation with 100% oxygen Induction Type: IV induction Ventilation: Mask ventilation without difficulty Laryngoscope Size: Miller and 3 Grade View: Grade I Tube type: Oral Tube size: 8.0 mm Number of attempts: 1 Airway Equipment and Method: Stylet and Oral airway Placement Confirmation: ETT inserted through vocal cords under direct vision, positive ETCO2 and breath sounds checked- equal and bilateral Secured at: 23 cm Tube secured with: Tape Dental Injury: Teeth and Oropharynx as per pre-operative assessment

## 2020-12-16 NOTE — Anesthesia Postprocedure Evaluation (Signed)
Anesthesia Post Note  Patient: JOHNCARLO LAXSON  Procedure(s) Performed: RECURRENT OPEN RIGHT INGUINAL HERNIA REPAIR WITH MESH (Right: Abdomen)     Patient location during evaluation: PACU Anesthesia Type: General Level of consciousness: awake and alert Pain management: pain level controlled Vital Signs Assessment: post-procedure vital signs reviewed and stable Respiratory status: spontaneous breathing, nonlabored ventilation and respiratory function stable Cardiovascular status: blood pressure returned to baseline and stable Postop Assessment: no apparent nausea or vomiting Anesthetic complications: no   No notable events documented.  Last Vitals:  Vitals:   12/16/20 1400 12/16/20 1420  BP: 126/77 136/68  Pulse: 67 70  Resp: 15 16  Temp:  36.5 C  SpO2: 95% 100%    Last Pain:  Vitals:   12/16/20 1420  TempSrc:   PainSc: 2                  Lidia Collum

## 2020-12-16 NOTE — Transfer of Care (Signed)
Immediate Anesthesia Transfer of Care Note  Patient: Adam Rosales  Procedure(s) Performed: RECURRENT OPEN RIGHT INGUINAL HERNIA REPAIR WITH MESH (Right: Abdomen)  Patient Location: PACU  Anesthesia Type:General  Level of Consciousness: awake, alert  and oriented  Airway & Oxygen Therapy: Patient Spontanous Breathing and Patient connected to face mask oxygen  Post-op Assessment: Report given to RN and Post -op Vital signs reviewed and stable  Post vital signs: Reviewed and stable  Last Vitals:  Vitals Value Taken Time  BP    Temp    Pulse    Resp    SpO2      Last Pain:  Vitals:   12/16/20 0958  TempSrc:   PainSc: 0-No pain         Complications: No notable events documented.

## 2020-12-16 NOTE — H&P (Signed)
Admitting Physician: Nickola Major Saulo Anthis  Service: General surgery  CC: Right inguinal hernia  Subjective   HPI: Adam Rosales is an 70 y.o. male who is here for open recurrent right inguinal hernia repair  Past Medical History:  Diagnosis Date   Dyslipidemia    History of kidney stones    Hyperlipidemia    Hypertension    Pre-diabetes     Past Surgical History:  Procedure Laterality Date   COLONOSCOPY  2009   Dr.Mann   INGUINAL HERNIA REPAIR Left     Family History  Problem Relation Age of Onset   Heart disease Father 38   Heart attack Father    Heart disease Maternal Grandfather    Heart disease Paternal Grandfather    Lung cancer Paternal Grandfather    Prostate cancer Brother    Colon cancer Brother    Other Brother        Agent orange    Social:  reports that he has never smoked. He has never used smokeless tobacco. He reports current alcohol use. He reports that he does not use drugs.  Allergies: No Known Allergies  Medications: Current Outpatient Medications  Medication Instructions   aspirin EC 81 mg, Oral, Daily   atorvastatin (LIPITOR) 20 mg, Oral, Daily   hydrochlorothiazide (MICROZIDE) 12.5 mg, Oral, Daily   ibuprofen (ADVIL) 200 mg, Oral, Every 8 hours PRN   Multiple Vitamin (MULTIVITAMIN WITH MINERALS) TABS tablet 1 tablet, Oral, Daily   tamsulosin (FLOMAX) 0.4 mg, Oral, Daily    ROS - all of the below systems have been reviewed with the patient and positives are indicated with bold text General: chills, fever or night sweats Eyes: blurry vision or double vision ENT: epistaxis or sore throat Allergy/Immunology: itchy/watery eyes or nasal congestion Hematologic/Lymphatic: bleeding problems, blood clots or swollen lymph nodes Endocrine: temperature intolerance or unexpected weight changes Breast: new or changing breast lumps or nipple discharge Resp: cough, shortness of breath, or wheezing CV: chest pain or dyspnea on exertion GI: as  per HPI GU: dysuria, trouble voiding, or hematuria MSK: joint pain or joint stiffness Neuro: TIA or stroke symptoms Derm: pruritus and skin lesion changes Psych: anxiety and depression  Objective   PE Blood pressure (!) 139/92, pulse (!) 57, temperature 98.6 F (37 C), temperature source Oral, resp. rate 16, height '5\' 11"'$  (1.803 m), weight 92.5 kg, SpO2 100 %. Constitutional: NAD; conversant; no deformities Eyes: Moist conjunctiva; no lid lag; anicteric; PERRL Neck: Trachea midline; no thyromegaly Lungs: Normal respiratory effort; no tactile fremitus CV: RRR; no palpable thrills; no pitting edema GI: Abd previous laparoscopic incisions well-healed palpable right inguinal hernia, reducible; no palpable hepatosplenomegaly MSK: Normal range of motion of extremities; no clubbing/cyanosis Psychiatric: Appropriate affect; alert and oriented x3 Lymphatic: No palpable cervical or axillary lymphadenopathy  No results found for this or any previous visit (from the past 24 hour(s)).  Imaging Orders  No imaging studies ordered today     Assessment and Plan   Adam Rosales is an 70 y.o. male with a recurrent right inguinal hernia.  He had a laparoscopic bilateral inguinal hernia repair in the past.  I recommended open right inguinal hernia repair with mesh.  The procedure itself as well as its risk, benefits, and alternatives were discussed the patient in full who granted consent to proceed.  We will proceed to the operating room as scheduled.  Felicie Morn, MD  [facility] Use [email or web address] to contact on call  provider

## 2020-12-16 NOTE — Op Note (Signed)
   Patient: Adam Rosales (Apr 30, 1950, [id number])  Date of Surgery: 12/16/2020   Preoperative Diagnosis: RECURRENT RIGHT INGUINAL HERNIA   Postoperative Diagnosis: RECURRENT RIGHT INGUINAL HERNIA   Surgical Procedure: RECURRENT OPEN RIGHT INGUINAL HERNIA REPAIR WITH MESH: [id number]   Operative Team Members:  Surgeon(s) and Role:    * Leylanie Woodmansee, Nickola Major, MD - Primary   Anesthesiologist: Lidia Collum, MD CRNA: Sharlette Dense, CRNA   Anesthesia: General   Fluids:  Total I/O In: -  Out: 25 0000000  Complications: None  Drains:  none   Specimen: None  Disposition:  PACU - hemodynamically stable.  Plan of Care: Discharge to home after PACU    Indications for Procedure: [name] is a 70 y.o. male who presented with a recurrent right inguinal hernia  Findings: Technique: Lichtenstein Hernia Location: direct right inguinal hernia Mesh Size &Type:  15 cm x 7cm Ethibond mesh Mesh Fixation: Ethibond suture  Infection status: Patient: Private Patient Elective Case Case: Elective Infection Present At Time Of Surgery (PATOS): None   Description of Procedure:  The patient was positioned supine, padded and secured to the bed, with both arms tucked.  The abdomen was widely prepped and draped.  A time out procedure was performed.    An oblique incision was made overlying the right external inguinal ring and dissection was carried down through the subcutaneous tissue until the aponeurosis of the external abdominal oblique was identified.  The aponeurosis was incised along it's fibers, and the inguinal canal was entered.  The ilioinguinal nerve was identified and preserved.  The spermatic cord was encircled with a Penrose drain. The cord was inspected. There was an DIRECT hernia.  The direct hernia sac was invaginated into the preperitoneal space and closed with 2-0 Vicryl suture.  A piece of Ethicon ultrapro mesh was opened, trimmed to 15cm x 7cm and a slit was  placed down the middle, lengthwise.  One end of the mesh was rounded off and sutured directly to the pubic bone with 0 ethibond.  The same suture was used, continuously, to fixate the lateral aspect of the mesh to the shelving edge of the inguinal ligament.  Medially, the mesh was positioned below the external oblique aponeurosis and fixated down to the internal oblique muscle utilizing interrupted 2-0 Vicryl suture.  No fixation was placed superior to the opening of the internal ring.  The limbs of the mesh were placed around the spermatic cord, tucked up under the external oblique aponeurosis, and fixated laterally to the shelving edge of the inguinal ligament.  The mesh space was irrigated with saline.  The external abdominal oblique fascia was closed with a running Vicryl suture, and the external ring was reconstructed.  Scarpa's fascia was closed with Vicryl suture. The skin closed with 4-0 Monocryl subcuticular suture and skin glue.   Louanna Raw, MD General, Bariatric, & Minimally Invasive Surgery [facility], [place]

## 2020-12-16 NOTE — Anesthesia Preprocedure Evaluation (Addendum)
Anesthesia Evaluation  Patient identified by MRN, date of birth, ID band Patient awake    Reviewed: Allergy & Precautions, NPO status , Patient's Chart, lab work & pertinent test results  History of Anesthesia Complications Negative for: history of anesthetic complications  Airway Mallampati: I  TM Distance: >3 FB Neck ROM: Full    Dental  (+) Dental Advisory Given, Teeth Intact   Pulmonary neg pulmonary ROS,    Pulmonary exam normal        Cardiovascular hypertension, Normal cardiovascular exam     Neuro/Psych negative neurological ROS     GI/Hepatic negative GI ROS, Neg liver ROS,   Endo/Other  negative endocrine ROS  Renal/GU negative Renal ROS  negative genitourinary   Musculoskeletal negative musculoskeletal ROS (+)   Abdominal   Peds  Hematology negative hematology ROS (+)   Anesthesia Other Findings   Reproductive/Obstetrics                           Anesthesia Physical Anesthesia Plan  ASA: 2  Anesthesia Plan: General   Post-op Pain Management:    Induction: Intravenous  PONV Risk Score and Plan: 2 and Ondansetron, Dexamethasone, Treatment may vary due to age or medical condition and Midazolam  Airway Management Planned: Oral ETT  Additional Equipment: None  Intra-op Plan:   Post-operative Plan: Extubation in OR  Informed Consent: I have reviewed the patients History and Physical, chart, labs and discussed the procedure including the risks, benefits and alternatives for the proposed anesthesia with the patient or authorized representative who has indicated his/her understanding and acceptance.     Dental advisory given  Plan Discussed with:   Anesthesia Plan Comments:         Anesthesia Quick Evaluation

## 2020-12-17 ENCOUNTER — Encounter (HOSPITAL_COMMUNITY): Payer: Self-pay | Admitting: Surgery

## 2020-12-22 ENCOUNTER — Encounter: Payer: BC Managed Care – PPO | Admitting: Family Medicine

## 2021-01-02 ENCOUNTER — Encounter: Payer: Self-pay | Admitting: Family Medicine

## 2021-01-02 ENCOUNTER — Other Ambulatory Visit: Payer: Self-pay

## 2021-01-02 ENCOUNTER — Ambulatory Visit: Payer: Medicare PPO | Admitting: Family Medicine

## 2021-01-02 VITALS — BP 130/80 | HR 73 | Temp 98.5°F | Ht 70.5 in | Wt 201.0 lb

## 2021-01-02 DIAGNOSIS — F419 Anxiety disorder, unspecified: Secondary | ICD-10-CM

## 2021-01-02 DIAGNOSIS — Z0000 Encounter for general adult medical examination without abnormal findings: Secondary | ICD-10-CM

## 2021-01-02 DIAGNOSIS — N200 Calculus of kidney: Secondary | ICD-10-CM | POA: Diagnosis not present

## 2021-01-02 DIAGNOSIS — Z8719 Personal history of other diseases of the digestive system: Secondary | ICD-10-CM

## 2021-01-02 DIAGNOSIS — E785 Hyperlipidemia, unspecified: Secondary | ICD-10-CM

## 2021-01-02 DIAGNOSIS — Z86010 Personal history of colonic polyps: Secondary | ICD-10-CM

## 2021-01-02 DIAGNOSIS — Z125 Encounter for screening for malignant neoplasm of prostate: Secondary | ICD-10-CM

## 2021-01-02 DIAGNOSIS — L03116 Cellulitis of left lower limb: Secondary | ICD-10-CM

## 2021-01-02 DIAGNOSIS — R350 Frequency of micturition: Secondary | ICD-10-CM

## 2021-01-02 DIAGNOSIS — Z8042 Family history of malignant neoplasm of prostate: Secondary | ICD-10-CM | POA: Diagnosis not present

## 2021-01-02 DIAGNOSIS — N401 Enlarged prostate with lower urinary tract symptoms: Secondary | ICD-10-CM

## 2021-01-02 DIAGNOSIS — I700 Atherosclerosis of aorta: Secondary | ICD-10-CM | POA: Diagnosis not present

## 2021-01-02 DIAGNOSIS — E669 Obesity, unspecified: Secondary | ICD-10-CM

## 2021-01-02 DIAGNOSIS — Z9889 Other specified postprocedural states: Secondary | ICD-10-CM

## 2021-01-02 MED ORDER — ATORVASTATIN CALCIUM 20 MG PO TABS: 20.0000 mg | ORAL_TABLET | Freq: Every day | ORAL | 3 refills | Status: DC

## 2021-01-02 MED ORDER — TAMSULOSIN HCL 0.4 MG PO CAPS: 0.4000 mg | ORAL_CAPSULE | Freq: Every day | ORAL | 3 refills | Status: DC

## 2021-01-02 NOTE — Progress Notes (Addendum)
Adam Rosales is a 70 y.o. male who presents for annual wellness visit,CPE and follow-up on chronic medical conditions.  He has lost a significant mount of weight over the last year or so due to diet and exercise.  He has been checking his blood pressure at home and has been getting readings in the systolic range of the [id number].  He is wondering whether he needs to be on the HCTZ.  He is also noted difficulty with some nocturia but has not made any changes in his fluid status late at night.  He is not sure whether the Flomax is working.  He does have a history of aortic atherosclerosis and is presently on Lipitor.  Was recently treated at an urgent care center with a cephalosporin however was switched to Septra 2 days ago by the surgeon and he was seen for his hernia and states that he has made some improvement since then.  He does have a history of colonic polyp and is scheduled for follow-up colonoscopy.  Anxiety seems to be much less of an issue.  He finds that walking helps with his overall psyche and very much enjoys this.  He is in the process of slowing down at work and will eventually retire but no time and immediate future.  He has a history of renal stones was not had any recently.  There is also positive family history for prostate cancer.  His most recent PSA was 3.  He and his wife are contemplating taking more time off for travel.   Immunizations and Health Maintenance Immunization History  Administered Date(s) Administered   DTaP 09/27/1995   Fluad Quad(high Dose 65+) 12/20/2018   Influenza Split 02/03/2012   Influenza, High Dose Seasonal PF 01/31/2017   Influenza, Quadrivalent, Recombinant, Inj, Pf 01/22/2018   Influenza-Unspecified 02/16/2016, 01/22/2018   Moderna Sars-Covid-2 Vaccination 06/07/2019, 07/02/2019   PFIZER(Purple Top)SARS-COV-2 Vaccination 01/18/2020, 07/30/2020   PPD Test 06/06/1995   Pneumococcal Conjugate-13 01/22/2018   Pneumococcal Polysaccharide-23 08/11/2006    Pneumococcal-Unspecified 01/22/2018   Tdap 08/11/2006, 07/18/2017   Zoster Recombinat (Shingrix) 01/31/2017, 07/18/2017   Zoster, Live 07/05/2011   Health Maintenance Due  Topic Date Due   INFLUENZA VACCINE  11/24/2020    Last colonoscopy: 03/25/17 Last PSA: 12/19/19  3.0 Dentist: Q six months  Ophtho: Q year Exercise: walking QD  Other doctors caring for patient include: Dr.Jacobs [facility]  Advanced Directives: Does Patient Have a Medical Advance Directive?: Yes Type of Advance Directive: Living will Does patient want to make changes to medical advance directive?: No - Patient declined  Depression screen:  See questionnaire below.     Depression screen [facility] 2/9 01/02/2021 09/11/2020 12/19/2019 07/18/2017 02/23/2016  Decreased Interest 0 0 0 0 0  Down, Depressed, Hopeless 0 0 0 0 0  PHQ - 2 Score 0 0 0 0 0    Fall Screen: See Questionaire below.   Fall Risk  01/02/2021 09/11/2020 12/19/2019 07/18/2017 02/23/2016  Falls in the past year? 0 0 0 No No  Number falls in past yr: 0 0 - - -  Injury with Fall? 0 0 - - -  Risk for fall due to : No Fall Risks No Fall Risks No Fall Risks - -  Follow up Falls evaluation completed - - - -    ADL screen:  See questionnaire below.  Functional Status Survey: Is the patient deaf or have difficulty hearing?: No Does the patient have difficulty seeing, even when wearing glasses/contacts?: No Does the  patient have difficulty concentrating, remembering, or making decisions?: No Does the patient have difficulty walking or climbing stairs?: No Does the patient have difficulty dressing or bathing?: No Does the patient have difficulty doing errands alone such as visiting a doctor's office or shopping?: No   Review of Systems  Constitutional: -, -unexpected weight change, -anorexia, -fatigue Allergy: -sneezing, -itching, -congestion Dermatology: denies changing moles, rash, lumps ENT: -runny nose, -ear pain, -sore throat,  Cardiology:  -chest pain,  -palpitations, -orthopnea, Respiratory: -cough, -shortness of breath, -dyspnea on exertion, -wheezing,  Gastroenterology: -abdominal pain, -nausea, -vomiting, -diarrhea, -constipation, -dysphagia Hematology: -bleeding or bruising problems Musculoskeletal: -arthralgias, -myalgias, -joint swelling, -back pain, - Ophthalmology: -vision changes,  Urology: -dysuria, -difficulty urinating,  -urinary frequency, -urgency, incontinence Neurology: -, -numbness, , -memory loss, -falls, -dizziness    PHYSICAL EXAM:  General Appearance: Alert, cooperative, no distress, appears stated age Head: Normocephalic, without obvious abnormality, atraumatic Eyes: PERRL, conjunctiva/corneas clear, EOM's intact,  Ears: Normal TM's and external ear canals Nose: Nares normal, mucosa normal, no drainage or sinus   tenderness Throat: Lips, mucosa, and tongue normal; teeth and gums normal Neck: Supple, no lymphadenopathy, thyroid:no enlargement/tenderness/nodules; no carotid bruit or JVD Lungs: Clear to auscultation bilaterally without wheezes, rales or ronchi; respirations unlabored Heart: Regular rate and rhythm, S1 and S2 normal, no murmur, rub or gallop Abdomen: Soft, non-tender, nondistended, normoactive bowel sounds, no masses, no hepatosplenomegaly Extremities: Exam of the left lateral calf does show erythema warmth and tenderness with a central 2 cm lesion. Pulses: 2+ and symmetric all extremities Skin: Skin color, texture, turgor normal, no rashes or lesions Lymph nodes: Cervical, supraclavicular, and axillary nodes normal Neurologic: CNII-XII intact, normal strength, sensation and gait; reflexes 2+ and symmetric throughout   Psych: Normal mood, affect, hygiene and grooming  ASSESSMENT/PLAN: Routine general medical examination at a health care facility  Aortic atherosclerosis [facility])  Renal stone  Anxiety  Family history of prostate cancer  Benign prostatic hyperplasia with urinary frequency  Hx  of adenomatous colonic polyps  Hyperlipidemia with target LDL less than 100  Obesity (BMI 30-39.9)  Hx of inguinal herniorrhaphy  Cellulitis of left lower extremity  Screening for prostate cancer I recommend that he stop the HCTZ and keep track of his blood pressure with his home machine.  If he seizes going up he will keep me informed.  He should continue on his Septra for the [place] and if there is any questions he will call me.  He will continue on his present medications.  Follow-up with gastroenterology concerning the colonic polyps. Also recommend that he cut down on fluids and empty his bladder before going to bed.  If he continues to have will call for further evaluation and treatment  Discussed PSA screening (risks/benefits), recommended at least 30 minutes of aerobic activity at least 5 days/week; Immunization recommendations discussed.  Colonoscopy recommendations reviewed.   Medicare Attestation I have personally reviewed: The patient's medical and social history Their use of alcohol, tobacco or illicit drugs Their current medications and supplements The patient's functional ability including ADLs,fall risks, home safety risks, cognitive, and hearing and visual impairment Diet and physical activities Evidence for depression or mood disorders  The patient's weight, height, and BMI have been recorded in the chart.  I have made referrals, counseling, and provided education to the patient based on review of the above and I have provided the patient with a written personalized care plan for preventive services.     Jill Alexanders, MD   01/02/2021  9/13 I discussed the results with him and we have decided to repeat the PSA with % in 1 month.  He is comfortable with that.

## 2021-01-03 LAB — CBC WITH DIFFERENTIAL/PLATELET
Basophils Absolute: 0.1 10*3/uL (ref 0.0–0.2)
Basos: 1 %
EOS (ABSOLUTE): 0.2 10*3/uL (ref 0.0–0.4)
Eos: 3 %
Hematocrit: 46 % (ref 37.5–51.0)
Hemoglobin: 15.4 g/dL (ref 13.0–17.7)
Immature Grans (Abs): 0 10*3/uL (ref 0.0–0.1)
Immature Granulocytes: 0 %
Lymphocytes Absolute: 1.4 10*3/uL (ref 0.7–3.1)
Lymphs: 20 %
MCH: 31.4 pg (ref 26.6–33.0)
MCHC: 33.5 g/dL (ref 31.5–35.7)
MCV: 94 fL (ref 79–97)
Monocytes Absolute: 0.7 10*3/uL (ref 0.1–0.9)
Monocytes: 10 %
Neutrophils Absolute: 4.6 10*3/uL (ref 1.4–7.0)
Neutrophils: 66 %
Platelets: 337 10*3/uL (ref 150–450)
RBC: 4.91 x10E6/uL (ref 4.14–5.80)
RDW: 12.7 % (ref 11.6–15.4)
WBC: 6.9 10*3/uL (ref 3.4–10.8)

## 2021-01-03 LAB — LIPID PANEL
Chol/HDL Ratio: 2.4 ratio (ref 0.0–5.0)
Cholesterol, Total: 109 mg/dL (ref 100–199)
HDL: 45 mg/dL (ref >39)
LDL Chol Calc (NIH): 51 mg/dL (ref 0–99)
Triglycerides: 57 mg/dL (ref 0–149)
VLDL Cholesterol Cal: 13 mg/dL (ref 5–40)

## 2021-01-03 LAB — COMPREHENSIVE METABOLIC PANEL
ALT: 13 IU/L (ref 0–44)
AST: 20 IU/L (ref 0–40)
Albumin/Globulin Ratio: 1.8 (ref 1.2–2.2)
Albumin: 4.3 g/dL (ref 3.8–4.8)
Alkaline Phosphatase: 68 IU/L (ref 44–121)
BUN/Creatinine Ratio: 10 (ref 10–24)
BUN: 8 mg/dL (ref 8–27)
Bilirubin Total: 0.7 mg/dL (ref 0.0–1.2)
CO2: 25 mmol/L (ref 20–29)
Calcium: 9.4 mg/dL (ref 8.6–10.2)
Chloride: 98 mmol/L (ref 96–106)
Creatinine, Ser: 0.82 mg/dL (ref 0.76–1.27)
Globulin, Total: 2.4 g/dL (ref 1.5–4.5)
Glucose: 95 mg/dL (ref 65–99)
Potassium: 4.1 mmol/L (ref 3.5–5.2)
Sodium: 139 mmol/L (ref 134–144)
Total Protein: 6.7 g/dL (ref 6.0–8.5)
eGFR: 95 mL/min/{1.73_m2} (ref >59)

## 2021-01-03 LAB — PSA: Prostate Specific Ag, Serum: 5.1 ng/mL — ABNORMAL HIGH (ref 0.0–4.0)

## 2021-01-06 LAB — FPSA% REFLEX
% FREE PSA: 9.5 %
PSA, FREE: 0.52 ng/mL

## 2021-01-06 LAB — PSA TOTAL (REFLEX TO FREE): Prostate Specific Ag, Serum: 5.5 ng/mL — ABNORMAL HIGH (ref 0.0–4.0)

## 2021-01-06 LAB — SPECIMEN STATUS REPORT

## 2021-01-06 NOTE — Addendum Note (Signed)
Addended by: Denita Lung on: 01/06/2021 01:22 PM   Modules accepted: Orders

## 2021-01-07 ENCOUNTER — Telehealth: Payer: Self-pay

## 2021-01-07 NOTE — Telephone Encounter (Signed)
Sent my chart message to advise of nurse visit for repeat labs . [place]

## 2021-01-07 NOTE — Telephone Encounter (Signed)
-----   Message from [place] sent at 01/06/2021  9:51 AM EDT ----- He returned your call, please call

## 2021-02-12 ENCOUNTER — Other Ambulatory Visit: Payer: Self-pay

## 2021-02-12 ENCOUNTER — Other Ambulatory Visit: Payer: Medicare PPO

## 2021-02-12 DIAGNOSIS — Z8042 Family history of malignant neoplasm of prostate: Secondary | ICD-10-CM

## 2021-02-13 LAB — PSA, TOTAL AND FREE
PSA, Free Pct: 13 %
PSA, Free: 0.48 ng/mL
Prostate Specific Ag, Serum: 3.7 ng/mL (ref 0.0–4.0)

## 2021-02-14 ENCOUNTER — Encounter: Payer: Self-pay | Admitting: Family Medicine

## 2021-02-14 DIAGNOSIS — Z8042 Family history of malignant neoplasm of prostate: Secondary | ICD-10-CM

## 2021-03-25 NOTE — Progress Notes (Signed)
Preoperative testing

## 2021-04-29 DIAGNOSIS — L570 Actinic keratosis: Secondary | ICD-10-CM | POA: Diagnosis not present

## 2021-04-29 DIAGNOSIS — L2089 Other atopic dermatitis: Secondary | ICD-10-CM | POA: Diagnosis not present

## 2021-04-29 DIAGNOSIS — D225 Melanocytic nevi of trunk: Secondary | ICD-10-CM | POA: Diagnosis not present

## 2021-04-29 DIAGNOSIS — L814 Other melanin hyperpigmentation: Secondary | ICD-10-CM | POA: Diagnosis not present

## 2021-04-29 DIAGNOSIS — D485 Neoplasm of uncertain behavior of skin: Secondary | ICD-10-CM | POA: Diagnosis not present

## 2021-04-29 DIAGNOSIS — L02224 Furuncle of groin: Secondary | ICD-10-CM | POA: Diagnosis not present

## 2021-04-29 DIAGNOSIS — D2261 Melanocytic nevi of right upper limb, including shoulder: Secondary | ICD-10-CM | POA: Diagnosis not present

## 2021-04-29 DIAGNOSIS — L821 Other seborrheic keratosis: Secondary | ICD-10-CM | POA: Diagnosis not present

## 2021-05-01 HISTORY — PX: OTHER SURGICAL HISTORY: SHX169

## 2021-05-07 ENCOUNTER — Encounter: Payer: Self-pay | Admitting: Family Medicine

## 2021-06-09 DIAGNOSIS — Z20822 Contact with and (suspected) exposure to covid-19: Secondary | ICD-10-CM | POA: Diagnosis not present

## 2021-06-10 NOTE — Progress Notes (Signed)
Start time: 9:41 End time: 10:05  Virtual Visit via Video Note  I connected with Adam Rosales on 06/11/21 by a video enabled telemedicine application and verified that I am speaking with the correct person using two identifiers.  Location: Patient: home Provider: office   I discussed the limitations of evaluation and management by telemedicine and the availability of in person appointments. The patient expressed understanding and agreed to proceed.  History of Present Illness:  Chief Complaint  Patient presents with   Covid Positive    VIRTUAL positive covid. Test was taken at pharmacy on Tuesday and got results Wed. Symptoms started Monday. Cough, stuffy nose and body aches.     He first did a COVID test on Sunday evening, which was negative.  He doesn't recall if he really had  much in the way of symptoms at that time, tested because his wife has [place].  He mainly noticed symptoms starting Monday evening, 2/13. Tested Tuesday, + result on Wednesday.    His symptoms are mild--he has some congestion (more in chest than head) Only slight headache. Hasn't slept well in the last 2 days, waking up frequently (unsure why). Slight body ache. Walked his usual 8 mile walk yesterday, but felt more fatigued. Today he only walked 2 No cough, no shortness of breath.  No n/v/d or abdominal pain, stool just slightly looser. Normal appetite. No known fever or chills.  Took statin this morning.  He had COVID once in the past, prior to vaccinations being available. He has had COVID vaccinations x5, had booster last in 12/2020 (not showing in chart, got [facility]).  PMH, PSH, SH reviewed HLD, BPH, h/o IFG H/o HTN, no longer on meds.  BP was slightly high last night.  Outpatient Encounter Medications as of 06/11/2021  Medication Sig Note   aspirin EC 81 MG tablet Take 81 mg by mouth daily.    atorvastatin (LIPITOR) 20 MG tablet Take 1 tablet (20 mg total) by mouth daily.     molnupiravir EUA (LAGEVRIO) 200 mg CAPS capsule Take 4 capsules (800 mg total) by mouth 2 (two) times daily for 5 days.    Multiple Vitamin (MULTIVITAMIN WITH MINERALS) TABS tablet Take 1 tablet by mouth daily.    acetaminophen (TYLENOL) 500 MG tablet Take 1 tablet (500 mg total) by mouth every 6 (six) hours as needed. (Patient not taking: Reported on 06/11/2021) 06/11/2021: As needed   ibuprofen (ADVIL) 200 MG tablet Take 200 mg by mouth every 8 (eight) hours as needed (for pain.). (Patient not taking: Reported on 06/11/2021) 06/11/2021: As needed   tamsulosin (FLOMAX) 0.4 MG CAPS capsule Take 1 capsule (0.4 mg total) by mouth daily. (Patient not taking: Reported on 06/11/2021)    [DISCONTINUED] oxyCODONE-acetaminophen (PERCOCET) 5-325 MG tablet Take 1 tablet by mouth every 4 (four) hours as needed for severe pain. (Patient not taking: Reported on 01/02/2021)    No facility-administered encounter medications on file as of 06/11/2021.   NOT taking molnupiravir prior to today's visit  No Known Allergies  ROS: as reported above, in HPI.    Observations/Objective:  BP 123/80    Pulse 80    Temp 99 F (37.2 C) (Oral) Comment: not working   Ht 5' 10.5" (1.791 m)    Wt 185 lb (83.9 kg)    BMI 26.17 kg/m   Well-appearing, pleasant male. He appears comfortable.  He doesn't sound particularly congested.  There is no sniffling, throat-clearing or coughing during the visit. He is  speaking easily and comfortably. Exam is limited due  to the virtual nature of the visit.   Assessment and Plan:  COVID-19 virus infection - reviewed SE of meds, supportive measures, s/sx complications. Symptoms very mild. Anti-viral rx'd due to age; sx mild, up to pt if wants to start, within 5d sx - Plan: molnupiravir EUA (LAGEVRIO) 200 mg CAPS capsule  Supportive measures reviewed. Discussed that given how mild his symptoms are, it is up to him if he wants to take anti-viral meds.  I don't think there is harm, so  prescribed, but up to him if he chooses to start.  Advised of need to start within 5d of symptoms.  First day of symptoms, 2/13, counts as day 0 You need to stay isolated for days 1-5 (Tuesday through Saturday). If you don't have fever, respiratory symptoms are resolving (or minimal, as they are now), then you may leave your house on days 6-10 (2/19-2/23), but wear a mask 100% of the time, don't eat with others. Restrictions end on day 11 (2/24).  Stay well hydrated. Use tylenol and/or ibuprofen only if needed for achiness, headache, pain, or fever. If you develop increasing chest congestion or cough, I recommend Mucinex (plain or DM) If you develop increasing runny nose or sinus pain, you can use a decongestant (sudafed). Monitor your blood pressure while taking sudafed, to ensure not going over 140/90. If BP high, instead you can try sinus rinses, if needed for sinus pressure.  Contact us/seek re-evaluation if you develop persistently high fever, pain with breathing, chest pain, shortness of breath, neurologic symptoms (changes in speech/thinking, weakness, dizziness), leg pain and swelling, or other new concerns.  I hope you and your wife feel better soon!     Follow Up Instructions:    I discussed the assessment and treatment plan with the patient. The patient was provided an opportunity to ask questions and all were answered. The patient agreed with the plan and demonstrated an understanding of the instructions.   The patient was advised to call back or seek an in-person evaluation if the symptoms worsen or if the condition fails to improve as anticipated.  I spent 27 minutes dedicated to the care of this patient, including pre-visit review of records, face to face time, post-visit ordering of testing and documentation.    Vikki Ports, MD

## 2021-06-11 ENCOUNTER — Encounter: Payer: Self-pay | Admitting: Family Medicine

## 2021-06-11 ENCOUNTER — Other Ambulatory Visit: Payer: Self-pay

## 2021-06-11 ENCOUNTER — Telehealth: Payer: Medicare PPO | Admitting: Family Medicine

## 2021-06-11 VITALS — BP 123/80 | HR 80 | Temp 99.0°F | Ht 70.5 in | Wt 185.0 lb

## 2021-06-11 DIAGNOSIS — U071 COVID-19: Secondary | ICD-10-CM | POA: Diagnosis not present

## 2021-06-11 MED ORDER — MOLNUPIRAVIR EUA 200MG CAPSULE
4.0000 | ORAL_CAPSULE | Freq: Two times a day (BID) | ORAL | 0 refills | Status: AC
Start: 2021-06-11 — End: 2021-06-16

## 2021-06-11 NOTE — Patient Instructions (Signed)
First day of symptoms, 2/13, counts as day 0 You need to stay isolated for days 1-5 (Tuesday through Saturday). If you don't have fever, respiratory symptoms are resolving (or minimal, as they are now), then you may leave your house on days 6-10 (2/19-2/23), but wear a mask 100% of the time, don't eat with others. Restrictions end on day 11 (2/24).  Take the anti-viral medication twice daily for 5 days.  Stay well hydrated. Use tylenol and/or ibuprofen only if needed for achiness, headache, pain, or fever. If you develop increasing chest congestion or cough, I recommend Mucinex (plain or DM) If you develop increasing runny nose or sinus pain, you can use a decongestant (sudafed). Monitor your blood pressure while taking sudafed, to ensure not going over 140/90. If BP high, instead you can try sinus rinses, if needed for sinus pressure.  Contact us/seek re-evaluation if you develop persistently high fever, pain with breathing, chest pain, shortness of breath, neurologic symptoms (changes in speech/thinking, weakness, dizziness), leg pain and swelling, or other new concerns.  I hope you and your wife feel better soon!

## 2021-06-16 DIAGNOSIS — L309 Dermatitis, unspecified: Secondary | ICD-10-CM | POA: Diagnosis not present

## 2021-06-16 DIAGNOSIS — L080 Pyoderma: Secondary | ICD-10-CM | POA: Diagnosis not present

## 2021-06-16 DIAGNOSIS — L089 Local infection of the skin and subcutaneous tissue, unspecified: Secondary | ICD-10-CM | POA: Diagnosis not present

## 2021-06-16 DIAGNOSIS — L03818 Cellulitis of other sites: Secondary | ICD-10-CM | POA: Diagnosis not present

## 2021-07-27 ENCOUNTER — Encounter: Payer: Self-pay | Admitting: Family Medicine

## 2021-07-27 ENCOUNTER — Ambulatory Visit: Payer: Medicare PPO | Admitting: Family Medicine

## 2021-07-27 VITALS — BP 138/77 | HR 69 | Temp 97.0°F | Ht 70.0 in | Wt 191.6 lb

## 2021-07-27 DIAGNOSIS — R972 Elevated prostate specific antigen [PSA]: Secondary | ICD-10-CM | POA: Diagnosis not present

## 2021-07-27 DIAGNOSIS — I700 Atherosclerosis of aorta: Secondary | ICD-10-CM

## 2021-07-27 NOTE — Progress Notes (Addendum)
? ?  Subjective:  ? ? Patient ID: Adam Rosales, male    DOB: 04-15-1951, 71 y.o.   MRN: [id number] ? ?HPI ?He is here for recheck.  He is not checking his blood pressure at home and did bring his cuff in with him.  He also would like his PSA checked again since it was elevated in the past.  He also has a history of hyperlipidemia and also aortic atherosclerosis.  He is on a statin drug.  He has changed his eating habits and has lost some weight and wants to see the effect of that on his lipids. ? ? ?Review of Systems ? ?   ?Objective:  ? Physical Exam ?His blood pressure cuff was compared to ours.  His numbers are below 130/80. ? ? ? ?   ?Assessment & Plan:  ? ?Aortic atherosclerosis ([place]) - Plan: Lipid panel ? ?Elevated PSA - Plan: PSA ?No need for blood pressure medications since he is lost weight and see be doing quite nicely.  Recommend he check his blood pressure no more often than once a month. ?I then discussed his cholesterol numbers with him.  Explained that under the circumstances we will probably not stop statin drug. ?

## 2021-07-28 LAB — LIPID PANEL
Chol/HDL Ratio: 2.5 ratio (ref 0.0–5.0)
Cholesterol, Total: 117 mg/dL (ref 100–199)
HDL: 46 mg/dL (ref >39)
LDL Chol Calc (NIH): 57 mg/dL (ref 0–99)
Triglycerides: 68 mg/dL (ref 0–149)
VLDL Cholesterol Cal: 14 mg/dL (ref 5–40)

## 2021-07-28 LAB — PSA: Prostate Specific Ag, Serum: 4.4 ng/mL — ABNORMAL HIGH (ref 0.0–4.0)

## 2021-09-24 DIAGNOSIS — L538 Other specified erythematous conditions: Secondary | ICD-10-CM | POA: Diagnosis not present

## 2021-09-24 DIAGNOSIS — L0292 Furuncle, unspecified: Secondary | ICD-10-CM | POA: Diagnosis not present

## 2021-09-24 DIAGNOSIS — L02211 Cutaneous abscess of abdominal wall: Secondary | ICD-10-CM | POA: Diagnosis not present

## 2021-10-19 DIAGNOSIS — A4902 Methicillin resistant Staphylococcus aureus infection, unspecified site: Secondary | ICD-10-CM | POA: Diagnosis not present

## 2021-10-19 DIAGNOSIS — L570 Actinic keratosis: Secondary | ICD-10-CM | POA: Diagnosis not present

## 2021-10-19 DIAGNOSIS — L821 Other seborrheic keratosis: Secondary | ICD-10-CM | POA: Diagnosis not present

## 2021-11-17 DIAGNOSIS — E785 Hyperlipidemia, unspecified: Secondary | ICD-10-CM | POA: Diagnosis not present

## 2021-11-17 DIAGNOSIS — Z7982 Long term (current) use of aspirin: Secondary | ICD-10-CM | POA: Diagnosis not present

## 2021-11-17 DIAGNOSIS — Z8042 Family history of malignant neoplasm of prostate: Secondary | ICD-10-CM | POA: Diagnosis not present

## 2021-11-17 DIAGNOSIS — R030 Elevated blood-pressure reading, without diagnosis of hypertension: Secondary | ICD-10-CM | POA: Diagnosis not present

## 2021-11-17 DIAGNOSIS — Z8249 Family history of ischemic heart disease and other diseases of the circulatory system: Secondary | ICD-10-CM | POA: Diagnosis not present

## 2021-11-26 DIAGNOSIS — L570 Actinic keratosis: Secondary | ICD-10-CM | POA: Diagnosis not present

## 2021-12-25 ENCOUNTER — Telehealth: Payer: Self-pay | Admitting: Family Medicine

## 2021-12-25 NOTE — Telephone Encounter (Signed)
Tried calling patient to schedule Medicare Annual Wellness Visit (AWV) either virtually or in office.  No answer   Last AWV 01/02/22  please schedule at anytime with health coach

## 2021-12-30 ENCOUNTER — Encounter: Payer: Self-pay | Admitting: Internal Medicine

## 2022-01-06 DIAGNOSIS — L820 Inflamed seborrheic keratosis: Secondary | ICD-10-CM | POA: Diagnosis not present

## 2022-01-06 DIAGNOSIS — L570 Actinic keratosis: Secondary | ICD-10-CM | POA: Diagnosis not present

## 2022-01-06 DIAGNOSIS — Z86010 Personal history of colonic polyps: Secondary | ICD-10-CM | POA: Diagnosis not present

## 2022-01-06 DIAGNOSIS — R972 Elevated prostate specific antigen [PSA]: Secondary | ICD-10-CM | POA: Diagnosis not present

## 2022-01-06 DIAGNOSIS — I700 Atherosclerosis of aorta: Secondary | ICD-10-CM | POA: Diagnosis not present

## 2022-01-06 DIAGNOSIS — E669 Obesity, unspecified: Secondary | ICD-10-CM | POA: Diagnosis not present

## 2022-01-06 DIAGNOSIS — N401 Enlarged prostate with lower urinary tract symptoms: Secondary | ICD-10-CM | POA: Diagnosis not present

## 2022-01-06 DIAGNOSIS — E785 Hyperlipidemia, unspecified: Secondary | ICD-10-CM | POA: Diagnosis not present

## 2022-01-06 DIAGNOSIS — Z23 Encounter for immunization: Secondary | ICD-10-CM | POA: Diagnosis not present

## 2022-01-06 DIAGNOSIS — R350 Frequency of micturition: Secondary | ICD-10-CM | POA: Diagnosis not present

## 2022-01-06 DIAGNOSIS — Z0000 Encounter for general adult medical examination without abnormal findings: Secondary | ICD-10-CM | POA: Diagnosis not present

## 2022-01-06 DIAGNOSIS — L538 Other specified erythematous conditions: Secondary | ICD-10-CM | POA: Diagnosis not present

## 2022-01-11 ENCOUNTER — Ambulatory Visit (INDEPENDENT_AMBULATORY_CARE_PROVIDER_SITE_OTHER): Payer: Medicare PPO | Admitting: Family Medicine

## 2022-01-11 ENCOUNTER — Encounter: Payer: Self-pay | Admitting: Family Medicine

## 2022-01-11 VITALS — BP 122/72 | HR 72 | Temp 98.2°F | Ht 70.0 in | Wt 194.4 lb

## 2022-01-11 DIAGNOSIS — R350 Frequency of micturition: Secondary | ICD-10-CM

## 2022-01-11 DIAGNOSIS — Z86010 Personal history of colonic polyps: Secondary | ICD-10-CM | POA: Diagnosis not present

## 2022-01-11 DIAGNOSIS — R972 Elevated prostate specific antigen [PSA]: Secondary | ICD-10-CM

## 2022-01-11 DIAGNOSIS — Z23 Encounter for immunization: Secondary | ICD-10-CM | POA: Diagnosis not present

## 2022-01-11 DIAGNOSIS — Z0000 Encounter for general adult medical examination without abnormal findings: Secondary | ICD-10-CM

## 2022-01-11 DIAGNOSIS — E669 Obesity, unspecified: Secondary | ICD-10-CM | POA: Diagnosis not present

## 2022-01-11 DIAGNOSIS — N401 Enlarged prostate with lower urinary tract symptoms: Secondary | ICD-10-CM

## 2022-01-11 DIAGNOSIS — I700 Atherosclerosis of aorta: Secondary | ICD-10-CM | POA: Diagnosis not present

## 2022-01-11 DIAGNOSIS — E785 Hyperlipidemia, unspecified: Secondary | ICD-10-CM | POA: Diagnosis not present

## 2022-01-11 MED ORDER — ATORVASTATIN CALCIUM 20 MG PO TABS: 20.0000 mg | ORAL_TABLET | Freq: Every day | ORAL | 3 refills | Status: DC

## 2022-01-11 NOTE — Progress Notes (Signed)
Adam Rosales Adam Rosales is a 71 y.o. male who presents for annual wellness visit ,CPEand follow-up on chronic medical conditions.  He has no particular concerns or complaints.  He continues on Lipitor and having no difficulty with that.  Presently he is not taking Flomax but does have a history of BPH type symptoms.  He is also taking aspirin regularly.  Does have a history of colonic polyps and is scheduled for another colonoscopy next year.  Has a history of elevated PSA.  His work and home life are going well.  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   Immunizations and Health Maintenance Immunization History  Administered Date(s) Administered   DTaP 09/27/1995   Fluad Quad(high Dose 65+) 12/20/2018   Influenza Split 02/03/2012   Influenza, High Dose Seasonal PF 01/31/2017   Influenza, Quadrivalent, Recombinant, Inj, Pf 01/22/2018   Influenza-Unspecified 02/16/2016, 01/22/2018, 01/01/2021   Moderna Sars-Covid-2 Vaccination 06/07/2019, 07/02/2019   PFIZER(Purple Top)SARS-COV-2 Vaccination 01/18/2020, 07/30/2020   PPD Test 06/06/1995   Pfizer Covid-19 Vaccine Bivalent Booster 72yr & up 01/01/2021   Pneumococcal Conjugate-13 01/22/2018   Pneumococcal Polysaccharide-23 08/11/2006   Pneumococcal-Unspecified 01/22/2018   Tdap 08/11/2006, 07/18/2017   Zoster Recombinat (Shingrix) 01/31/2017, 07/18/2017   Zoster, Live 07/05/2011   Health Maintenance Due  Topic Date Due   COVID-19 Vaccine (6 - Moderna series) 05/03/2021   INFLUENZA VACCINE  11/24/2021    Last colonoscopy: 03/25/17 Dr. JEdison Nasuti Last PSA: 07/27/21 (4.4) Dentist: Q six months  Ophtho: Q year   Exercise: walking QD   Other doctors caring for patient include: Dr. JArdis HughsGI              Advanced Directives: Does Patient Have a Medical Advance Directive?: Yes Type of Advance Directive: Healthcare Power of Attorney, Living will Does patient want to make changes to medical advance directive?: No  - Patient declined Copy of [place] Chart?: No - copy requested  Depression screen:  See questionnaire below.        01/11/2022   10:30 AM 07/27/2021   10:26 AM 01/02/2021    1:44 PM 09/11/2020    1:30 PM 12/19/2019   10:10 AM  Depression screen PHQ 2/9  Decreased Interest 0 0 0 0 0  Down, Depressed, Hopeless 0 0 0 0 0  PHQ - 2 Score 0 0 0 0 0    Fall Screen: See Questionaire below.      01/11/2022   10:29 AM 07/27/2021   10:26 AM 01/02/2021    1:44 PM 09/11/2020    1:30 PM 12/19/2019   10:09 AM  Fall Risk   Falls in the past year? 0 0 0 0 0  Number falls in past yr: 0 0 0 0   Injury with Fall? 0 0 0 0   Risk for fall due to : No Fall Risks No Fall Risks No Fall Risks No Fall Risks No Fall Risks  Follow up Falls evaluation completed Falls evaluation completed Falls evaluation completed      ADL screen:  See questionnaire below.  Functional Status Survey: Is the patient deaf or have difficulty hearing?: No Does the patient have difficulty seeing, even when wearing glasses/contacts?: No Does the patient have difficulty concentrating, remembering, or making decisions?: No Does the patient have difficulty walking or climbing stairs?: No Does the patient have difficulty dressing or bathing?: No Does the patient have difficulty doing errands alone such as visiting a  doctor's office or shopping?: No   Review of Systems  Constitutional: -, -unexpected weight change, -anorexia, -fatigue Allergy: -sneezing, -itching, -congestion Dermatology: denies changing moles, rash, lumps ENT: -runny nose, -ear pain, -sore throat,  Cardiology:  -chest pain, -palpitations, -orthopnea, Respiratory: -cough, -shortness of breath, -dyspnea on exertion, -wheezing,  Gastroenterology: -abdominal pain, -nausea, -vomiting, -diarrhea, -constipation, -dysphagia Hematology: -bleeding or bruising problems Musculoskeletal: -arthralgias, -myalgias, -joint swelling, -back pain,  - Ophthalmology: -vision changes,  Urology: -dysuria, -difficulty urinating,  -urinary frequency, -urgency, incontinence Neurology: -, -numbness, , -memory loss, -falls, -dizziness   PHYSICAL EXAM:  General Appearance: Alert, cooperative, no distress, appears stated age Head: Normocephalic, without obvious abnormality, atraumatic Eyes: PERRL, conjunctiva/corneas clear, EOM's intact,  Ears: Normal TM's and external ear canals Nose: Nares normal, mucosa normal, no drainage or sinus   tenderness Throat: Lips, mucosa, and tongue normal; teeth and gums normal Neck: Supple, no lymphadenopathy, thyroid:no enlargement/tenderness/nodules; no carotid bruit or JVD Lungs: Clear to auscultation bilaterally without wheezes, rales or ronchi; respirations unlabored Heart: Regular rate and rhythm, S1 and S2 normal, no murmur, rub or gallop Abdomen: Soft, non-tender, nondistended, normoactive bowel sounds, no masses, no hepatosplenomegaly Skin: Skin color, texture, turgor normal, no rashes or lesions Lymph nodes: Cervical, supraclavicular, and axillary nodes normal Neurologic: CNII-XII intact, normal strength, sensation and gait; reflexes 2+ and symmetric throughout   Psych: Normal mood, affect, hygiene and grooming  ASSESSMENT/PLAN: Routine general medical examination at a health care facility - Plan: CBC with Differential/Platelet, Comprehensive metabolic panel, Lipid panel  Aortic atherosclerosis (HCC) - Plan: atorvastatin (LIPITOR) 20 MG tablet  Hyperlipidemia with target LDL less than 100 - Plan: atorvastatin (LIPITOR) 20 MG tablet, Lipid panel  Elevated PSA - Plan: PSA  Benign prostatic hyperplasia with urinary frequency - Plan: PSA  Obesity (BMI 30-39.9) - Plan: CBC with Differential/Platelet, Comprehensive metabolic panel  Hx of adenomatous colonic polyps - Plan: Comprehensive metabolic panel  Need for influenza vaccination - Plan: Flu Vaccine QUAD High Dose(Fluad)   Discussed PSA  screening (risks/benefits), recommended at least 30 minutes of aerobic activity at least 5 days/week; ; healthy diet and alcohol recommendations (less than or equal to 2 drinks/day) reviewed; Immunization recommendations discussed.  Recommend he get the COVID and RSV. colonoscopy recommendations reviewed.   Medicare Attestation I have personally reviewed: The patient's medical and social history Their use of alcohol, tobacco or illicit drugs Their current medications and supplements The patient's functional ability including ADLs,fall risks, home safety risks, cognitive, and hearing and visual impairment Diet and physical activities Evidence for depression or mood disorders  The patient's weight, height, and BMI have been recorded in the chart.  I have made referrals, counseling, and provided education to the patient based on review of the above and I have provided the patient with a written personalized care plan for preventive services.     Jill Alexanders, MD   01/11/2022

## 2022-01-11 NOTE — Patient Instructions (Signed)
Health Maintenance, Male Adopting a healthy lifestyle and getting preventive care are important in promoting health and wellness. Ask your health care provider about: The right schedule for you to have regular tests and exams. Things you can do on your own to prevent diseases and keep yourself healthy. What should I know about diet, weight, and exercise? Eat a healthy diet  Eat a diet that includes plenty of vegetables, fruits, low-fat dairy products, and lean protein. Do not eat a lot of foods that are high in solid fats, added sugars, or sodium. Maintain a healthy weight Body mass index (BMI) is a measurement that can be used to identify possible weight problems. It estimates body fat based on height and weight. Your health care provider can help determine your BMI and help you achieve or maintain a healthy weight. Get regular exercise Get regular exercise. This is one of the most important things you can do for your health. Most adults should: Exercise for at least 150 minutes each week. The exercise should increase your heart rate and make you sweat (moderate-intensity exercise). Do strengthening exercises at least twice a week. This is in addition to the moderate-intensity exercise. Spend less time sitting. Even light physical activity can be beneficial. Watch cholesterol and blood lipids Have your blood tested for lipids and cholesterol at 71 years of age, then have this test every 5 years. You may need to have your cholesterol levels checked more often if: Your lipid or cholesterol levels are high. You are older than 71 years of age. You are at high risk for heart disease. What should I know about cancer screening? Many types of cancers can be detected early and may often be prevented. Depending on your health history and family history, you may need to have cancer screening at various ages. This may include screening for: Colorectal cancer. Prostate cancer. Skin cancer. Lung  cancer. What should I know about heart disease, diabetes, and high blood pressure? Blood pressure and heart disease High blood pressure causes heart disease and increases the risk of stroke. This is more likely to develop in people who have high blood pressure readings or are overweight. Talk with your health care provider about your target blood pressure readings. Have your blood pressure checked: Every 3-5 years if you are 18-39 years of age. Every year if you are 40 years old or older. If you are between the ages of 65 and 75 and are a current or former smoker, ask your health care provider if you should have a one-time screening for abdominal aortic aneurysm (AAA). Diabetes Have regular diabetes screenings. This checks your fasting blood sugar level. Have the screening done: Once every three years after age 45 if you are at a normal weight and have a low risk for diabetes. More often and at a younger age if you are overweight or have a high risk for diabetes. What should I know about preventing infection? Hepatitis B If you have a higher risk for hepatitis B, you should be screened for this virus. Talk with your health care provider to find out if you are at risk for hepatitis B infection. Hepatitis C Blood testing is recommended for: Everyone born from 1945 through 1965. Anyone with known risk factors for hepatitis C. Sexually transmitted infections (STIs) You should be screened each year for STIs, including gonorrhea and chlamydia, if: You are sexually active and are younger than 71 years of age. You are older than 71 years of age and your   health care provider tells you that you are at risk for this type of infection. Your sexual activity has changed since you were last screened, and you are at increased risk for chlamydia or gonorrhea. Ask your health care provider if you are at risk. Ask your health care provider about whether you are at high risk for HIV. Your health care provider  may recommend a prescription medicine to help prevent HIV infection. If you choose to take medicine to prevent HIV, you should first get tested for HIV. You should then be tested every 3 months for as long as you are taking the medicine. Follow these instructions at home: Alcohol use Do not drink alcohol if your health care provider tells you not to drink. If you drink alcohol: Limit how much you have to 0-2 drinks a day. Know how much alcohol is in your drink. In the [place] one drink equals one 12 oz bottle of beer (355 mL), one 5 oz glass of wine (148 mL), or one 1 oz glass of hard liquor (44 mL). Lifestyle Do not use any products that contain nicotine or tobacco. These products include cigarettes, chewing tobacco, and vaping devices, such as e-cigarettes. If you need help quitting, ask your health care provider. Do not use street drugs. Do not share needles. Ask your health care provider for help if you need support or information about quitting drugs. General instructions Schedule regular health, dental, and eye exams. Stay current with your vaccines. Tell your health care provider if: You often feel depressed. You have ever been abused or do not feel safe at home. Summary Adopting a healthy lifestyle and getting preventive care are important in promoting health and wellness. Follow your health care provider's instructions about healthy diet, exercising, and getting tested or screened for diseases. Follow your health care provider's instructions on monitoring your cholesterol and blood pressure. This information is not intended to replace advice given to you by your health care provider. Make sure you discuss any questions you have with your health care provider. Document Revised: 09/01/2020 Document Reviewed: 09/01/2020 Elsevier Patient Education  2023 [employer] Inc[employer]  

## 2022-01-12 LAB — CBC WITH DIFFERENTIAL/PLATELET
Basophils Absolute: 0 10*3/uL (ref 0.0–0.2)
Basos: 1 %
EOS (ABSOLUTE): 0.2 10*3/uL (ref 0.0–0.4)
Eos: 4 %
Hematocrit: 48.3 % (ref 37.5–51.0)
Hemoglobin: 16.5 g/dL (ref 13.0–17.7)
Immature Grans (Abs): 0 10*3/uL (ref 0.0–0.1)
Immature Granulocytes: 0 %
Lymphocytes Absolute: 1.9 10*3/uL (ref 0.7–3.1)
Lymphs: 38 %
MCH: 32.3 pg (ref 26.6–33.0)
MCHC: 34.2 g/dL (ref 31.5–35.7)
MCV: 95 fL (ref 79–97)
Monocytes Absolute: 0.4 10*3/uL (ref 0.1–0.9)
Monocytes: 7 %
Neutrophils Absolute: 2.6 10*3/uL (ref 1.4–7.0)
Neutrophils: 50 %
Platelets: 223 10*3/uL (ref 150–450)
RBC: 5.11 x10E6/uL (ref 4.14–5.80)
RDW: 12.9 % (ref 11.6–15.4)
WBC: 5.2 10*3/uL (ref 3.4–10.8)

## 2022-01-12 LAB — COMPREHENSIVE METABOLIC PANEL
ALT: 15 IU/L (ref 0–44)
AST: 21 IU/L (ref 0–40)
Albumin/Globulin Ratio: 2.3 — ABNORMAL HIGH (ref 1.2–2.2)
Albumin: 4.5 g/dL (ref 3.9–4.9)
Alkaline Phosphatase: 56 IU/L (ref 44–121)
BUN/Creatinine Ratio: 17 (ref 10–24)
BUN: 14 mg/dL (ref 8–27)
Bilirubin Total: 0.6 mg/dL (ref 0.0–1.2)
CO2: 23 mmol/L (ref 20–29)
Calcium: 9.1 mg/dL (ref 8.6–10.2)
Chloride: 103 mmol/L (ref 96–106)
Creatinine, Ser: 0.83 mg/dL (ref 0.76–1.27)
Globulin, Total: 2 g/dL (ref 1.5–4.5)
Glucose: 95 mg/dL (ref 70–99)
Potassium: 4.7 mmol/L (ref 3.5–5.2)
Sodium: 142 mmol/L (ref 134–144)
Total Protein: 6.5 g/dL (ref 6.0–8.5)
eGFR: 94 mL/min/{1.73_m2} (ref >59)

## 2022-01-12 LAB — LIPID PANEL
Chol/HDL Ratio: 2.5 ratio (ref 0.0–5.0)
Cholesterol, Total: 130 mg/dL (ref 100–199)
HDL: 52 mg/dL (ref >39)
LDL Chol Calc (NIH): 63 mg/dL (ref 0–99)
Triglycerides: 75 mg/dL (ref 0–149)
VLDL Cholesterol Cal: 15 mg/dL (ref 5–40)

## 2022-01-12 LAB — PSA: Prostate Specific Ag, Serum: 3.5 ng/mL (ref 0.0–4.0)

## 2022-01-26 ENCOUNTER — Emergency Department (HOSPITAL_COMMUNITY)
Admission: EM | Admit: 2022-01-26 | Discharge: 2022-01-26 | Disposition: A | Discharge status: Home / Self Care | Payer: Medicare PPO | Attending: Emergency Medicine | Admitting: Emergency Medicine

## 2022-01-26 ENCOUNTER — Emergency Department (HOSPITAL_COMMUNITY): Payer: Medicare PPO

## 2022-01-26 ENCOUNTER — Encounter (HOSPITAL_COMMUNITY): Payer: Self-pay

## 2022-01-26 DIAGNOSIS — Z7982 Long term (current) use of aspirin: Secondary | ICD-10-CM | POA: Insufficient documentation

## 2022-01-26 DIAGNOSIS — W208XXA Other cause of strike by thrown, projected or falling object, initial encounter: Secondary | ICD-10-CM | POA: Diagnosis not present

## 2022-01-26 DIAGNOSIS — S299XXA Unspecified injury of thorax, initial encounter: Secondary | ICD-10-CM

## 2022-01-26 DIAGNOSIS — S20219A Contusion of unspecified front wall of thorax, initial encounter: Secondary | ICD-10-CM | POA: Diagnosis not present

## 2022-01-26 DIAGNOSIS — J9811 Atelectasis: Secondary | ICD-10-CM | POA: Diagnosis not present

## 2022-01-26 MED ORDER — OXYCODONE-ACETAMINOPHEN 5-325 MG PO TABS
1.0000 | ORAL_TABLET | Freq: Once | ORAL | Status: DC
  Filled 2022-01-26: qty 1

## 2022-01-26 NOTE — ED Provider Notes (Signed)
[facility] DEPT Provider Note   CSN: [id number] Arrival date & time: 01/26/22  1612     History  Chief Complaint  Patient presents with   Rib cage pain    Adam Rosales is a 71 y.o. male.  Who presents emergency department with chief complaint of rib pain.  Patient states that he dropped his vacuum filter in the large outdoor garbage can and reached over the edge to leading to grab it and injured his right rib.  He had onset of breathlessness and rib pain lasting for a few hours but now significantly improved.  He denies any abdominal pain, bruising, coughing, hemoptysis, shortness of breath.  HPI     Home Medications Prior to Admission medications   Medication Sig Start Date End Date Taking? Authorizing Provider  aspirin EC 81 MG tablet Take 81 mg by mouth daily.    [provider]  atorvastatin (LIPITOR) 20 MG tablet Take 1 tablet (20 mg total) by mouth daily. 01/11/22   Denita Lung, MD  doxycycline (VIBRAMYCIN) 100 MG capsule Take by mouth. Patient not taking: Reported on 01/11/2022 07/09/21   [provider]  ibuprofen (ADVIL) 200 MG tablet Take 200 mg by mouth every 8 (eight) hours as needed (for pain.). Patient not taking: Reported on 06/11/2021    [provider]  Multiple Vitamin (MULTIVITAMIN WITH MINERALS) TABS tablet Take 1 tablet by mouth daily.    [provider]  mupirocin ointment (BACTROBAN) 2 % Apply topically. Patient not taking: Reported on 01/11/2022 06/29/21   [provider]  tamsulosin (FLOMAX) 0.4 MG CAPS capsule Take 1 capsule (0.4 mg total) by mouth daily. Patient not taking: Reported on 06/11/2021 01/02/21   Denita Lung, MD      Allergies    Patient has no known allergies.    Review of Systems   Review of Systems  Physical Exam Updated Vital Signs BP 138/83 (BP Location: Left Arm)   Pulse 67   Temp 98 F (36.7 C) (Oral)   Resp 18   SpO2 98%  Physical Exam Vitals  and nursing note reviewed.  Constitutional:      General: He is not in acute distress.    Appearance: He is well-developed. He is not diaphoretic.  HENT:     Head: Normocephalic and atraumatic.  Eyes:     General: No scleral icterus.    Conjunctiva/sclera: Conjunctivae normal.  Cardiovascular:     Rate and Rhythm: Normal rate and regular rhythm.     Heart sounds: Normal heart sounds.  Pulmonary:     Effort: Pulmonary effort is normal. No respiratory distress.     Breath sounds: Normal breath sounds.  Chest:     Comments: No significant tenderness to palpation, no crepitus, no step-off, no obvious bruising.  Right upper quadrant of the abdomen is nontender without bruising. Abdominal:     Palpations: Abdomen is soft.     Tenderness: There is no abdominal tenderness.  Musculoskeletal:     Cervical back: Normal range of motion and neck supple.  Skin:    General: Skin is warm and dry.  Neurological:     Mental Status: He is alert.  Psychiatric:        Behavior: Behavior normal.     ED Results / Procedures / Treatments   Labs (all labs ordered are listed, but only abnormal results are displayed) Labs Reviewed - No data to display  EKG None  Radiology DG Ribs  Unilateral W/Chest Right  Result Date: 01/26/2022 CLINICAL DATA:  Status post right rib trauma. EXAM: RIGHT RIBS AND CHEST - 3+ VIEW COMPARISON:  None Available. FINDINGS: A radiopaque marker was placed at the site of the patient's pain. No fracture or other bone lesions are seen involving the ribs. There is no evidence of pneumothorax or pleural effusion. Mild linear atelectasis is seen within the left lung base. Heart size and mediastinal contours are within normal limits. IMPRESSION: 1. No acute rib fracture. 2. Mild left basilar linear atelectasis. Electronically Signed   By: Virgina Norfolk M.D.   On: 01/26/2022 18:29    Procedures Procedures    Medications Ordered in ED Medications  oxyCODONE-acetaminophen  (PERCOCET/ROXICET) 5-325 MG per tablet 1 tablet (has no administration in time range)    ED Course/ Medical Decision Making/ A&P                           Medical Decision Making Patient here with right rib injury.  I visualized and interpreted the chest x-ray which showed no acute abnormalities including no evidence of rib fracture.  No evidence of right upper quadrant injury.  Patient able to take full and deep breaths without pain.  He feels significantly improved.  Appears appropriate for discharge at this time.  Given return precautions.           Final Clinical Impression(s) / ED Diagnoses Final diagnoses:  None    Rx / DC Orders ED Discharge Orders     None         Margarita Mail, PA-C 01/26/22 [place], [place], DO 01/26/22 2355

## 2022-01-26 NOTE — ED Provider Triage Note (Signed)
Emergency Medicine Provider Triage Evaluation Note  Adam Rosales , a 71 y.o. male  was evaluated in triage.  Pt complains of right rib pain.  Earlier today, he was bending down to throw something away in the garbage can. When he bent over, he got his right ribs caught on the edge of the can and he has had significant pain on the lateral side since then. He has not shortness of breath, dizziness, abdominal pain, nausea, vomiting, leg swelling, etc.   Review of Systems  Positive:  Negative:   Physical Exam  BP 138/83 (BP Location: Left Arm)   Pulse 67   Temp 98 F (36.7 C) (Oral)   Resp 18   SpO2 98%  Gen:   Awake, no distress   Resp:  Normal effort  MSK:   Moves extremities without difficulty  Other:  Reproducible right rib pain on ant. lateral lower rib  Medical Decision Making  Medically screening exam initiated at 5:00 PM.  Appropriate orders placed.  DAWAUN BRANCATO was informed that the remainder of the evaluation will be completed by another provider, this initial triage assessment does not replace that evaluation, and the importance of remaining in the ED until their evaluation is complete.     Adolphus Birchwood, PA-C 01/26/22 1702

## 2022-01-26 NOTE — Discharge Instructions (Signed)
Contact a health care provider if you have: Increased bruising or swelling. Pain that is not controlled with treatment. A fever. Get help right away if you: Have difficulty breathing or shortness of breath. Develop a continual cough, or you cough up thick or bloody mucus from your lungs (sputum). Feel nauseous or you vomit. Have pain in your abdomen.

## 2022-01-26 NOTE — ED Triage Notes (Signed)
Pt states that earlier today he was throwing something away in the garbage can and reached in quickly to not drop something and injured his R rib cage. Pt denies SOB.

## 2022-01-27 ENCOUNTER — Telehealth: Payer: Self-pay

## 2022-01-27 NOTE — Telephone Encounter (Signed)
Transition Care Management Follow-up Telephone Call Date of discharge and from where: [facility] 01/26/22 How have you been since you were released from the hospital? Better Any questions or concerns? No  Items Reviewed: Did the pt receive and understand the discharge instructions provided? Yes  Medications obtained and verified? Yes  Other? No  Any new allergies since your discharge? No  Dietary orders reviewed? Yes Do you have support at home? Yes   Home Care and Equipment/Supplies: Were home health services ordered? no   Follow up appointments reviewed:  PCP Hospital f/u appt confirmed? No  pt did not need f/u  [facility] f/u appt confirmed? No   Are transportation arrangements needed? No  If their condition worsens, is the pt aware to call PCP or go to the Emergency Dept.? Yes Was the patient provided with contact information for the PCP's office or ED? Yes Was to pt encouraged to call back with questions or concerns? Yes

## 2022-02-02 ENCOUNTER — Encounter: Payer: Self-pay | Admitting: Internal Medicine

## 2022-02-15 ENCOUNTER — Encounter: Payer: Self-pay | Admitting: Internal Medicine

## 2022-02-22 ENCOUNTER — Telehealth: Payer: Medicare PPO | Admitting: Medical

## 2022-02-22 ENCOUNTER — Encounter: Payer: Self-pay | Admitting: Medical

## 2022-02-22 ENCOUNTER — Other Ambulatory Visit: Payer: Medicare PPO

## 2022-02-22 VITALS — BP 133/78 | HR 79 | Wt 188.0 lb

## 2022-02-22 DIAGNOSIS — J029 Acute pharyngitis, unspecified: Secondary | ICD-10-CM

## 2022-02-22 DIAGNOSIS — J988 Other specified respiratory disorders: Secondary | ICD-10-CM | POA: Diagnosis not present

## 2022-02-22 NOTE — Progress Notes (Signed)
Subjective:     Patient ID: Adam Rosales, male   DOB: [birth year], 71 y.o.   MRN: [id number]  This visit type was conducted due to national recommendations for restrictions regarding the COVID-19 Pandemic (e.g. social distancing) in an effort to limit this patient's exposure and mitigate transmission in our community.  Due to their co-morbid illnesses, this patient is at least at moderate risk for complications without adequate follow up.  This format is felt to be most appropriate for this patient at this time.    Documentation for virtual audio and video telecommunications through [facility] encounter:  The patient was located at home. The provider was located in the office. The patient did consent to this visit and is aware of possible charges through their insurance for this visit.  The other persons participating in this telemedicine service were none. Time spent on call was 20 minutes and in review of previous records 20 minutes total.  This virtual service is not related to other E/M service within previous 7 days.   HPI Chief Complaint  Patient presents with   cough    Cough and Sore throat, phelgm, started Friday evening, fever, chills, negative covid x 2.    Virtual consult for illness. He notes 3 day hx/o feeling feverish, the next day after symptoms head stopped up, feels congestion in head and chest, getting up some phlegm.   No fever today.  Has some sore throat.  Using Claritin.  Using some ibuprofen as well.   Wife had same recently but she is better.  Did 2 covid tests that were both negative.  No nausea, no vomiting, no diarrhea.  Getting up some phlegm.  Very bad sore throat.   Having lots of sneezing.   Blowing nose all the time.   Suppose to lecture tomorrow.  Nonsmoker.  No hx/o asthma or lung disease.  No other aggravating or relieving factors. No other complaint.  Past Medical History:  Diagnosis Date   Dyslipidemia    History of kidney stones     Hyperlipidemia    Hypertension    Pre-diabetes    Current Outpatient Medications on File Prior to Visit  Medication Sig Dispense Refill   atorvastatin (LIPITOR) 20 MG tablet Take 1 tablet (20 mg total) by mouth daily. 90 tablet 3   ibuprofen (ADVIL) 200 MG tablet Take 200 mg by mouth every 8 (eight) hours as needed (for pain.).     Multiple Vitamin (MULTIVITAMIN) capsule Take 1 capsule by mouth daily.     No current facility-administered medications on file prior to visit.   Review of Systems As in subjective    Objective:   Physical Exam Due to coronavirus pandemic stay at home measures, patient visit was virtual and they were not examined in person.   BP 133/78   Pulse 79   Wt 188 lb (85.3 kg)   BMI 26.98 kg/m   Gen: wd, wn, nad No witnessed dyspnea or wheezing   In back parking lot: Pharmacy with mild erythema Lungs clear Heart rrr, normal s1, s2, no murmurs    Assessment:     Encounter Diagnoses  Name Primary?   Sore throat Yes   Respiratory tract infection        Plan:     I will have him come to our back parking lot for COVID and strep testing today.  He is around a lot of college students.  This could just be a viral URI we will screen for  other  I recommend salt water gargles, warm fluids, can use Tylenol for pain, can use Mucinex DM for cough and congestion, Chloraseptic spray or Cepacol lozenges for sore throat.  I will examine him in the back parking lot when he comes in for testing  After swabs were negative for strep and covid in parking lot , I examined.  Advised he use the supportive measures above.  If worse mid week, then call back  Ketan was seen today for cough.  Diagnoses and all orders for this visit:  Sore throat  Respiratory tract infection  F/u in back parking lot for testing

## 2022-03-23 DIAGNOSIS — L538 Other specified erythematous conditions: Secondary | ICD-10-CM | POA: Diagnosis not present

## 2022-03-23 DIAGNOSIS — L570 Actinic keratosis: Secondary | ICD-10-CM | POA: Diagnosis not present

## 2022-03-23 DIAGNOSIS — L578 Other skin changes due to chronic exposure to nonionizing radiation: Secondary | ICD-10-CM | POA: Diagnosis not present

## 2022-03-23 DIAGNOSIS — L820 Inflamed seborrheic keratosis: Secondary | ICD-10-CM | POA: Diagnosis not present

## 2022-07-07 ENCOUNTER — Encounter: Payer: Self-pay | Admitting: Gastroenterology

## 2022-07-14 DIAGNOSIS — L570 Actinic keratosis: Secondary | ICD-10-CM | POA: Diagnosis not present

## 2022-07-14 DIAGNOSIS — L218 Other seborrheic dermatitis: Secondary | ICD-10-CM | POA: Diagnosis not present

## 2022-07-14 DIAGNOSIS — L821 Other seborrheic keratosis: Secondary | ICD-10-CM | POA: Diagnosis not present

## 2022-07-14 DIAGNOSIS — Z872 Personal history of diseases of the skin and subcutaneous tissue: Secondary | ICD-10-CM | POA: Diagnosis not present

## 2022-07-14 DIAGNOSIS — L814 Other melanin hyperpigmentation: Secondary | ICD-10-CM | POA: Diagnosis not present

## 2022-07-14 DIAGNOSIS — L578 Other skin changes due to chronic exposure to nonionizing radiation: Secondary | ICD-10-CM | POA: Diagnosis not present

## 2022-07-14 DIAGNOSIS — D225 Melanocytic nevi of trunk: Secondary | ICD-10-CM | POA: Diagnosis not present

## 2022-07-14 DIAGNOSIS — L2089 Other atopic dermatitis: Secondary | ICD-10-CM | POA: Diagnosis not present

## 2022-07-14 DIAGNOSIS — Z09 Encounter for follow-up examination after completed treatment for conditions other than malignant neoplasm: Secondary | ICD-10-CM | POA: Diagnosis not present

## 2022-07-20 ENCOUNTER — Encounter: Payer: Self-pay | Admitting: Gastroenterology

## 2022-10-11 ENCOUNTER — Ambulatory Visit (AMBULATORY_SURGERY_CENTER): Payer: Medicare PPO

## 2022-10-11 VITALS — Ht 70.0 in | Wt 200.0 lb

## 2022-10-11 DIAGNOSIS — Z86010 Personal history of colonic polyps: Secondary | ICD-10-CM

## 2022-10-11 MED ORDER — NA SULFATE-K SULFATE-MG SULF 17.5-3.13-1.6 GM/177ML PO SOLN: 1.0000 | Freq: Once | ORAL | 0 refills | Status: AC

## 2022-10-11 NOTE — Progress Notes (Signed)
No egg or soy allergy known to patient  No issues known to pt with past sedation with any surgeries or procedures Patient denies ever being told they had issues or difficulty with intubation  No FH of Malignant Hyperthermia Pt is not on diet pills Pt is not on  home 02  Pt is not on blood thinners  Pt denies issues with constipation  No A fib or A flutter Have any cardiac testing pending--no  LOA: independent   Patient's chart reviewed by Cathlyn Parsons CNRA prior to previsit and patient appropriate for the [facility].  Previsit completed and red dot placed by patient's name on their procedure day (on provider's schedule).     PV completed with patient. Prep instructions reviewed and sent via mychart / home address. Good rx coupon for [facility] provided. Pt instructed to wait for the coupon in the mail before going to the pharmacy in order to get price reduction.

## 2022-10-13 ENCOUNTER — Encounter: Payer: Self-pay | Admitting: Gastroenterology

## 2022-10-15 ENCOUNTER — Ambulatory Visit: Payer: Medicare PPO | Admitting: Family Medicine

## 2022-10-15 ENCOUNTER — Encounter: Payer: Self-pay | Admitting: Family Medicine

## 2022-10-15 VITALS — BP 132/84 | HR 89 | Wt 207.2 lb

## 2022-10-15 DIAGNOSIS — J069 Acute upper respiratory infection, unspecified: Secondary | ICD-10-CM | POA: Diagnosis not present

## 2022-10-15 NOTE — Progress Notes (Signed)
   Subjective:    Patient ID: Adam Rosales, male    DOB: 09/22/1950, 72 y.o.   MRN: [id number]  HPI He states that on Tuesday he developed some slight rhinorrhea as well as nasal congestion and today noted the coughing was getting worse with some slight myalgias and intermittent productive cough.  No fever or chills.  No earache.   Review of Systems     Objective:    Physical Exam Alert and in no distress. Tympanic membranes and canals are normal. Pharyngeal area is normal. Neck is supple without adenopathy or thyromegaly. Cardiac exam shows a regular sinus rhythm without murmurs or gallops. Lungs are clear to auscultation.        Assessment & Plan:  URI with cough and congestion  Recommend supportive care with Tylenol for the fever aches and pains, Robitussin DM during the day and NyQuil at night.  Explained that he should get better by the end of the weekend and if there is any question he can call me at that point.

## 2022-11-02 ENCOUNTER — Ambulatory Visit (INDEPENDENT_AMBULATORY_CARE_PROVIDER_SITE_OTHER): Payer: Medicare PPO

## 2022-11-02 VITALS — Ht 70.0 in | Wt 200.0 lb

## 2022-11-02 DIAGNOSIS — Z0000 Encounter for general adult medical examination without abnormal findings: Secondary | ICD-10-CM | POA: Diagnosis not present

## 2022-11-02 NOTE — Progress Notes (Signed)
Subjective:   Adam Rosales is a 72 y.o. male who presents for Medicare Annual/Subsequent preventive examination.  Visit Complete: Virtual  I connected with  Adam Rosales on 11/02/22 by a audio enabled telemedicine application and verified that I am speaking with the correct person using two identifiers.  Patient Location: Home  Provider Location: Office/Clinic  I discussed the limitations of evaluation and management by telemedicine. The patient expressed understanding and agreed to proceed.    Review of Systems     Cardiac Risk Factors include: advanced age (>11men, >40 women)     Objective:    Today's Vitals   11/02/22 1326  Weight: 200 lb (90.7 kg)  Height: 5\' 10"  (1.778 m)   Body mass index is 28.7 kg/m.     11/02/2022    1:32 PM 01/26/2022    4:52 PM 01/11/2022   10:28 AM 01/02/2021    1:43 PM 12/12/2020    9:15 AM  Advanced Directives  Does Patient Have a Medical Advance Directive? Yes No Yes Yes Yes  Type of Estate agent of [place];Living will  Healthcare Power of [place];Living will Living will Living will;Healthcare Power of Attorney  Does patient want to make changes to medical advance directive?   No - Patient declined No - Patient declined   Copy of Healthcare Power of Attorney in Chart? No - copy requested  No - copy requested      Current Medications (verified) Outpatient Encounter Medications as of 11/02/2022  Medication Sig   atorvastatin (LIPITOR) 20 MG tablet Take 1 tablet (20 mg total) by mouth daily.   Multiple Vitamin (MULTIVITAMIN) capsule Take 1 capsule by mouth daily.   ibuprofen (ADVIL) 200 MG tablet Take 200 mg by mouth every 8 (eight) hours as needed (for pain.). (Patient not taking: Reported on 10/11/2022)   No facility-administered encounter medications on file as of 11/02/2022.    Allergies (verified) Patient has no known allergies.   History: Past Medical History:  Diagnosis Date   Dyslipidemia     History of kidney stones    Hyperlipidemia    Hypertension    Pre-diabetes    Past Surgical History:  Procedure Laterality Date   COLONOSCOPY  2009   Dr.Mann   INGUINAL HERNIA REPAIR Left    INGUINAL HERNIA REPAIR Right 12/16/2020   Procedure: RECURRENT OPEN RIGHT INGUINAL HERNIA REPAIR WITH MESH;  Surgeon: Stechschulte, Hyman Hopes, MD;  Location: [facility];  Service: General;  Laterality: Right;   SHX1 Right 05/01/2021   junctional nevus with moderate atypia   Family History  Problem Relation Age of Onset   Heart disease Father 30   Heart attack Father    Heart disease Maternal Grandfather    Heart disease Paternal Grandfather    Lung cancer Paternal Grandfather    Prostate cancer Brother    Colon cancer Brother    Other Brother        Agent orange   Social History   Socioeconomic History   Marital status: Married    Spouse name: Not on file   Number of children: 2   Years of education: Not on file   Highest education level: Not on file  Occupational History   Not on file  Tobacco Use   Smoking status: Never   Smokeless tobacco: Never  Vaping Use   Vaping Use: Never used  Substance and Sexual Activity   Alcohol use: Yes    Alcohol/week: 1.0 standard drink of alcohol  Types: 1 Glasses of wine per week    Comment: less than 1 per day   Drug use: No   Sexual activity: Yes  Other Topics Concern   Not on file  Social History Narrative   Not on file   Social Determinants of Health   Financial Resource Strain: Low Risk  (11/02/2022)   Overall Financial Resource Strain (CARDIA)    Difficulty of Paying Living Expenses: Not hard at all  Food Insecurity: No Food Insecurity (11/02/2022)   Hunger Vital Sign    Worried About Running Out of Food in the Last Year: Never true    Ran Out of Food in the Last Year: Never true  Transportation Needs: No Transportation Needs (11/02/2022)   PRAPARE - Administrator, Civil Service (Medical): No    Lack of Transportation  (Non-Medical): No  Physical Activity: Sufficiently Active (11/02/2022)   Exercise Vital Sign    Days of Exercise per Week: 7 days    Minutes of Exercise per Session: 150+ min  Stress: No Stress Concern Present (11/02/2022)   [employer] of Occupational Health - Occupational Stress Questionnaire    Feeling of Stress : Not at all  Social Connections: Moderately Integrated (11/02/2022)   Social Connection and Isolation Panel [NHANES]    Frequency of Communication with Friends and Family: More than three times a week    Frequency of Social Gatherings with Friends and Family: More than three times a week    Attends Religious Services: Never    Database administrator or Organizations: Yes    Attends Engineer, structural: More than 4 times per year    Marital Status: Married    Tobacco Counseling Counseling given: Not Answered   Clinical Intake:  Pre-visit preparation completed: Yes  Pain : No/denies pain     Nutritional Status: BMI 25 -29 Overweight Nutritional Risks: None Diabetes: No  How often do you need to have someone help you when you read instructions, pamphlets, or other written materials from your doctor or pharmacy?: 1 - Never  Interpreter Needed?: No  Information entered by :: NAllen LPN   Activities of Daily Living    11/02/2022    1:27 PM 01/11/2022   10:30 AM  In your present state of health, do you have any difficulty performing the following activities:  Hearing? 0 0  Vision? 0 0  Difficulty concentrating or making decisions? 0 0  Walking or climbing stairs? 0 0  Dressing or bathing? 0 0  Doing errands, shopping? 0 0  Preparing Food and eating ? N N  Using the Toilet? N N  In the past six months, have you accidently leaked urine? Y N  Do you have problems with loss of bowel control? N N  Managing your Medications? N N  Managing your Finances? N N  Housekeeping or managing your Housekeeping? N N    Patient Care Team: Ronnald Nian, MD  as PCP - General (Family Medicine)  Indicate any recent Medical Services you may have received from other than [facility] providers in the past year (date may be approximate).     Assessment:   This is a routine wellness examination for Adam Rosales.  Hearing/Vision screen Hearing Screening - Comments:: Denies hearing issues Vision Screening - Comments:: Regular eye exams, [facility]  Dietary issues and exercise activities discussed:     Goals Addressed             This Visit's Progress  Patient Stated       11/02/2022, wants to lose weight       Depression Screen    11/02/2022    1:34 PM 01/11/2022   10:30 AM 07/27/2021   10:26 AM 01/02/2021    1:44 PM 09/11/2020    1:30 PM 12/19/2019   10:10 AM 07/18/2017    9:12 AM  PHQ 2/9 Scores  PHQ - 2 Score 0 0 0 0 0 0 0  PHQ- 9 Score 0          Fall Risk    11/02/2022    1:33 PM 01/11/2022   10:29 AM 07/27/2021   10:26 AM 01/02/2021    1:44 PM 09/11/2020    1:30 PM  Fall Risk   Falls in the past year? 0 0 0 0 0  Number falls in past yr: 0 0 0 0 0  Injury with Fall? 0 0 0 0 0  Risk for fall due to : Medication side effect No Fall Risks No Fall Risks No Fall Risks No Fall Risks  Follow up Falls prevention discussed;Falls evaluation completed Falls evaluation completed Falls evaluation completed Falls evaluation completed     MEDICARE RISK AT HOME:  Medicare Risk at Home - 11/02/22 1333     Any stairs in or around the home? Yes    If so, are there any without handrails? No    Home free of loose throw rugs in walkways, pet beds, electrical cords, etc? Yes    Adequate lighting in your home to reduce risk of falls? Yes    Life alert? No    Use of a cane, walker or w/c? No    Grab bars in the bathroom? No    Shower chair or bench in shower? No    Elevated toilet seat or a handicapped toilet? No             TIMED UP AND GO:  Was the test performed?  No    Cognitive Function:        11/02/2022    1:35 PM 01/11/2022   10:32 AM   6CIT Screen  What Year? 0 points 0 points  What month? 0 points 0 points  What time? 0 points 0 points  Count back from 20 0 points 0 points  Months in reverse 0 points 0 points  Repeat phrase 0 points 0 points  Total Score 0 points 0 points    Immunizations Immunization History  Administered Date(s) Administered   DTaP 09/27/1995   Fluad Quad(high Dose 65+) 12/20/2018, 01/11/2022   Influenza Split 02/03/2012   Influenza, High Dose Seasonal PF 01/31/2017   Influenza, Quadrivalent, Recombinant, Inj, Pf 01/22/2018   Influenza-Unspecified 02/16/2016, 01/22/2018, 01/01/2021   Moderna Sars-Covid-2 Vaccination 06/07/2019, 07/02/2019   PFIZER(Purple Top)SARS-COV-2 Vaccination 01/18/2020, 07/30/2020   PPD Test 06/06/1995   Pfizer Covid-19 Vaccine Bivalent Booster 58yrs & up 01/01/2021   Pneumococcal Conjugate-13 01/22/2018   Pneumococcal Polysaccharide-23 08/11/2006   Pneumococcal-Unspecified 01/22/2018   Tdap 08/11/2006, 07/18/2017   Zoster Recombinant(Shingrix) 01/31/2017, 07/18/2017   Zoster, Live 07/05/2011    TDAP status: Up to date  Flu Vaccine status: Up to date  Pneumococcal vaccine status: Up to date  Covid-19 vaccine status: Information provided on how to obtain vaccines.   Qualifies for Shingles Vaccine? Yes   Zostavax completed Yes   Shingrix Completed?: Yes  Screening Tests Health Maintenance  Topic Date Due   COVID-19 Vaccine (6 - 2023-24 season) 12/25/2021   Colonoscopy  03/25/2022   INFLUENZA VACCINE  11/25/2022   Medicare Annual Wellness (AWV)  11/02/2023   DTaP/Tdap/Td (4 - Td or Tdap) 07/19/2027   Hepatitis C Screening  Completed   Zoster Vaccines- Shingrix  Completed   HPV VACCINES  Aged Out   Pneumonia Vaccine 79+ Years old  Discontinued    Health Maintenance  Health Maintenance Due  Topic Date Due   COVID-19 Vaccine (6 - 2023-24 season) 12/25/2021   Colonoscopy  03/25/2022    Colorectal cancer screening: Type of screening: Colonoscopy.  Completed 03/25/2017. Repeat every 5 years  Lung Cancer Screening: (Low Dose CT Chest recommended if Age 56-80 years, 20 pack-year currently smoking OR have quit w/in 15years.) does not qualify.   Lung Cancer Screening Referral: no  Additional Screening:  Hepatitis C Screening: does qualify; Completed 07/18/2017  Vision Screening: Recommended annual ophthalmology exams for early detection of glaucoma and other disorders of the eye. Is the patient up to date with their annual eye exam?  Yes  Who is the provider or what is the name of the office in which the patient attends annual eye exams? [facility] If pt is not established with a provider, would they like to be referred to a provider to establish care? No .   Dental Screening: Recommended annual dental exams for proper oral hygiene  Diabetic Foot Exam: n/a  Community Resource Referral / Chronic Care Management: CRR required this visit?  No   CCM required this visit?  No     Plan:     I have personally reviewed and noted the following in the patient's chart:   Medical and social history Use of alcohol, tobacco or illicit drugs  Current medications and supplements including opioid prescriptions. Patient is not currently taking opioid prescriptions. Functional ability and status Nutritional status Physical activity Advanced directives List of other physicians Hospitalizations, surgeries, and ER visits in previous 12 months Vitals Screenings to include cognitive, depression, and falls Referrals and appointments  In addition, I have reviewed and discussed with patient certain preventive protocols, quality metrics, and best practice recommendations. A written personalized care plan for preventive services as well as general preventive health recommendations were provided to patient.     Barb Merino, LPN   05/01/1094   After Visit Summary: (MyChart) Due to this being a telephonic visit, the after visit summary with patients  personalized plan was offered to patient via MyChart   Nurse Notes: none

## 2022-11-02 NOTE — Patient Instructions (Signed)
[name] , Thank you for taking time to come for your Medicare Wellness Visit. I appreciate your ongoing commitment to your health goals. Please review the following plan we discussed and let me know if I can assist you in the future.   These are the goals we discussed:  Goals      Patient Stated     11/02/2022, wants to lose weight        This is a list of the screening recommended for you and due dates:  Health Maintenance  Topic Date Due   COVID-19 Vaccine (6 - 2023-24 season) 12/25/2021   Colon Cancer Screening  03/25/2022   Flu Shot  11/25/2022   Medicare Annual Wellness Visit  11/02/2023   DTaP/Tdap/Td vaccine (4 - Td or Tdap) 07/19/2027   Hepatitis C Screening  Completed   Zoster (Shingles) Vaccine  Completed   HPV Vaccine  Aged Out   Pneumonia Vaccine  Discontinued    Advanced directives: Please bring a copy of your POA (Power of [place]) and/or Living Will to your next appointment.   Conditions/risks identified: none  Next appointment: Follow up in one year for your annual wellness visit.   Preventive Care 74 Years and Older, Male  Preventive care refers to lifestyle choices and visits with your health care provider that can promote health and wellness. What does preventive care include? A yearly physical exam. This is also called an annual well check. Dental exams once or twice a year. Routine eye exams. Ask your health care provider how often you should have your eyes checked. Personal lifestyle choices, including: Daily care of your teeth and gums. Regular physical activity. Eating a healthy diet. Avoiding tobacco and drug use. Limiting alcohol use. Practicing safe sex. Taking low doses of aspirin every day. Taking vitamin and mineral supplements as recommended by your health care provider. What happens during an annual well check? The services and screenings done by your health care provider during your annual well check will depend on your age, overall  health, lifestyle risk factors, and family history of disease. Counseling  Your health care provider may ask you questions about your: Alcohol use. Tobacco use. Drug use. Emotional well-being. Home and relationship well-being. Sexual activity. Eating habits. History of falls. Memory and ability to understand (cognition). Work and work Astronomer. Screening  You may have the following tests or measurements: Height, weight, and BMI. Blood pressure. Lipid and cholesterol levels. These may be checked every 5 years, or more frequently if you are over 16 years old. Skin check. Lung cancer screening. You may have this screening every year starting at age 60 if you have a 30-pack-year history of smoking and currently smoke or have quit within the past 15 years. Fecal occult blood test (FOBT) of the stool. You may have this test every year starting at age 31. Flexible sigmoidoscopy or colonoscopy. You may have a sigmoidoscopy every 5 years or a colonoscopy every 10 years starting at age 20. Prostate cancer screening. Recommendations will vary depending on your family history and other risks. Hepatitis C blood test. Hepatitis B blood test. Sexually transmitted disease (STD) testing. Diabetes screening. This is done by checking your blood sugar (glucose) after you have not eaten for a while (fasting). You may have this done every 1-3 years. Abdominal aortic aneurysm (AAA) screening. You may need this if you are a current or former smoker. Osteoporosis. You may be screened starting at age 53 if you are at high risk. Talk  with your health care provider about your test results, treatment options, and if necessary, the need for more tests. Vaccines  Your health care provider may recommend certain vaccines, such as: Influenza vaccine. This is recommended every year. Tetanus, diphtheria, and acellular pertussis (Tdap, Td) vaccine. You may need a Td booster every 10 years. Zoster vaccine. You may  need this after age 5. Pneumococcal 13-valent conjugate (PCV13) vaccine. One dose is recommended after age 79. Pneumococcal polysaccharide (PPSV23) vaccine. One dose is recommended after age 9. Talk to your health care provider about which screenings and vaccines you need and how often you need them. This information is not intended to replace advice given to you by your health care provider. Make sure you discuss any questions you have with your health care provider. Document Released: 05/09/2015 Document Revised: 12/31/2015 Document Reviewed: 02/11/2015 Elsevier Interactive Patient Education  2017 [employer].  Fall Prevention in the Home Falls can cause injuries. They can happen to people of all ages. There are many things you can do to make your home safe and to help prevent falls. What can I do on the outside of my home? Regularly fix the edges of walkways and driveways and fix any cracks. Remove anything that might make you trip as you walk through a door, such as a raised step or threshold. Trim any bushes or trees on the path to your home. Use bright outdoor lighting. Clear any walking paths of anything that might make someone trip, such as rocks or tools. Regularly check to see if handrails are loose or broken. Make sure that both sides of any steps have handrails. Any raised decks and porches should have guardrails on the edges. Have any leaves, snow, or ice cleared regularly. Use sand or salt on walking paths during winter. Clean up any spills in your garage right away. This includes oil or grease spills. What can I do in the bathroom? Use night lights. Install grab bars by the toilet and in the tub and shower. Do not use towel bars as grab bars. Use non-skid mats or decals in the tub or shower. If you need to sit down in the shower, use a plastic, non-slip stool. Keep the floor dry. Clean up any water that spills on the floor as soon as it happens. Remove soap buildup in  the tub or shower regularly. Attach bath mats securely with double-sided non-slip rug tape. Do not have throw rugs and other things on the floor that can make you trip. What can I do in the bedroom? Use night lights. Make sure that you have a light by your bed that is easy to reach. Do not use any sheets or blankets that are too big for your bed. They should not hang down onto the floor. Have a firm chair that has side arms. You can use this for support while you get dressed. Do not have throw rugs and other things on the floor that can make you trip. What can I do in the kitchen? Clean up any spills right away. Avoid walking on wet floors. Keep items that you use a lot in easy-to-reach places. If you need to reach something above you, use a strong step stool that has a grab bar. Keep electrical cords out of the way. Do not use floor polish or wax that makes floors slippery. If you must use wax, use non-skid floor wax. Do not have throw rugs and other things on the floor that can make you  trip. What can I do with my stairs? Do not leave any items on the stairs. Make sure that there are handrails on both sides of the stairs and use them. Fix handrails that are broken or loose. Make sure that handrails are as long as the stairways. Check any carpeting to make sure that it is firmly attached to the stairs. Fix any carpet that is loose or worn. Avoid having throw rugs at the top or bottom of the stairs. If you do have throw rugs, attach them to the floor with carpet tape. Make sure that you have a light switch at the top of the stairs and the bottom of the stairs. If you do not have them, ask someone to add them for you. What else can I do to help prevent falls? Wear shoes that: Do not have high heels. Have rubber bottoms. Are comfortable and fit you well. Are closed at the toe. Do not wear sandals. If you use a stepladder: Make sure that it is fully opened. Do not climb a closed  stepladder. Make sure that both sides of the stepladder are locked into place. Ask someone to hold it for you, if possible. Clearly mark and make sure that you can see: Any grab bars or handrails. First and last steps. Where the edge of each step is. Use tools that help you move around (mobility aids) if they are needed. These include: Canes. Walkers. Scooters. Crutches. Turn on the lights when you go into a dark area. Replace any light bulbs as soon as they burn out. Set up your furniture so you have a clear path. Avoid moving your furniture around. If any of your floors are uneven, fix them. If there are any pets around you, be aware of where they are. Review your medicines with your doctor. Some medicines can make you feel dizzy. This can increase your chance of falling. Ask your doctor what other things that you can do to help prevent falls. This information is not intended to replace advice given to you by your health care provider. Make sure you discuss any questions you have with your health care provider. Document Released: 02/06/2009 Document Revised: 09/18/2015 Document Reviewed: 05/17/2014 Elsevier Interactive Patient Education  2017 [employer].

## 2022-11-03 ENCOUNTER — Encounter: Payer: Medicare PPO | Admitting: Gastroenterology

## 2022-11-09 ENCOUNTER — Ambulatory Visit (AMBULATORY_SURGERY_CENTER): Payer: Medicare PPO | Admitting: Gastroenterology

## 2022-11-09 ENCOUNTER — Encounter: Payer: Self-pay | Admitting: Gastroenterology

## 2022-11-09 VITALS — BP 130/76 | HR 51 | Temp 98.0°F | Resp 17 | Ht 70.0 in | Wt 200.0 lb

## 2022-11-09 DIAGNOSIS — D125 Benign neoplasm of sigmoid colon: Secondary | ICD-10-CM

## 2022-11-09 DIAGNOSIS — D123 Benign neoplasm of transverse colon: Secondary | ICD-10-CM

## 2022-11-09 DIAGNOSIS — I10 Essential (primary) hypertension: Secondary | ICD-10-CM | POA: Diagnosis not present

## 2022-11-09 DIAGNOSIS — Z09 Encounter for follow-up examination after completed treatment for conditions other than malignant neoplasm: Secondary | ICD-10-CM | POA: Diagnosis not present

## 2022-11-09 DIAGNOSIS — Z86010 Personal history of colonic polyps: Secondary | ICD-10-CM

## 2022-11-09 DIAGNOSIS — R7303 Prediabetes: Secondary | ICD-10-CM | POA: Diagnosis not present

## 2022-11-09 MED ORDER — SODIUM CHLORIDE 0.9 % IV SOLN: 500.0000 mL | Freq: Once | INTRAVENOUS | Status: DC

## 2022-11-09 NOTE — Progress Notes (Signed)
 Pt's states no medical or surgical changes since previsit or office visit. 

## 2022-11-09 NOTE — Progress Notes (Signed)
 To pacu, VSS. Report to rn.tb °

## 2022-11-09 NOTE — Patient Instructions (Addendum)
High fiber diet. Use FiberCon 1-2 tablets PO daily. Continue present medications. Await pathology results. Repeat colonoscopy in 3 to 7 years for surveillance based on pathology results and history of previous adenomatous colon polyps.  Please read over handouts about polyps, hemorrhoids, diverticulosis and high fiber diets                                                    YOU HAD AN ENDOSCOPIC PROCEDURE TODAY AT THE [facility]:   Refer to the procedure report that was given to you for any specific questions about what was found during the examination.  If the procedure report does not answer your questions, please call your gastroenterologist to clarify.  If you requested that your care partner not be given the details of your procedure findings, then the procedure report has been included in a sealed envelope for you to review at your convenience later.  YOU SHOULD EXPECT: Some feelings of bloating in the abdomen. Passage of more gas than usual.  Walking can help get rid of the air that was put into your GI tract during the procedure and reduce the bloating. If you had a lower endoscopy (such as a colonoscopy or flexible sigmoidoscopy) you may notice spotting of blood in your stool or on the toilet paper. If you underwent a bowel prep for your procedure, you may not have a normal bowel movement for a few days.  Please Note:  You might notice some irritation and congestion in your nose or some drainage.  This is from the oxygen used during your procedure.  There is no need for concern and it should clear up in a day or so.  SYMPTOMS TO REPORT IMMEDIATELY:  Following lower endoscopy (colonoscopy or flexible sigmoidoscopy):  Excessive amounts of blood in the stool  Significant tenderness or worsening of abdominal pains  Swelling of the abdomen that is new, acute  Fever of 100F or higher  For urgent or emergent issues, a gastroenterologist can be reached at any hour by calling  [phone]. Do not use MyChart messaging for urgent concerns.    DIET:  We do recommend a small meal at first, but then you may proceed to your regular diet.  Drink plenty of fluids but you should avoid alcoholic beverages for 24 hours.  ACTIVITY:  You should plan to take it easy for the rest of today and you should NOT DRIVE or use heavy machinery until tomorrow (because of the sedation medicines used during the test).    FOLLOW UP: Our staff will call the number listed on your records the next business day following your procedure.  We will call around 7:15- 8:00 am to check on you and address any questions or concerns that you may have regarding the information given to you following your procedure. If we do not reach you, we will leave a message.     If any biopsies were taken you will be contacted by phone or by letter within the next 1-3 weeks.  Please call us at [phone] if you have not heard about the biopsies in 3 weeks.    SIGNATURES/CONFIDENTIALITY: You and/or your care partner have signed paperwork which will be entered into your electronic medical record.  These signatures attest to the fact that that the information above on your After  Visit Summary has been reviewed and is understood.  Full responsibility of the confidentiality of this discharge information lies with you and/or your care-partner.

## 2022-11-09 NOTE — Op Note (Signed)
[facility] Patient Name: Adam Rosales Procedure Date: 11/09/2022 9:19 AM MRN: [id number] Endoscopist: Corliss Parish , MD, [id number] Age: 72 Referring MD:  Date of Birth: 08/26/1950 Gender: Male Account #: [id number] Procedure:                Colonoscopy Indications:              Surveillance: Personal history of adenomatous                            polyps on last colonoscopy > 5 years ago Medicines:                Monitored Anesthesia Care Procedure:                Pre-Anesthesia Assessment:                           - Prior to the procedure, a History and Physical                            was performed, and patient medications and                            allergies were reviewed. The patient's tolerance of                            previous anesthesia was also reviewed. The risks                            and benefits of the procedure and the sedation                            options and risks were discussed with the patient.                            All questions were answered, and informed consent                            was obtained. Prior Anticoagulants: The patient has                            taken no anticoagulant or antiplatelet agents                            except for NSAID medication. ASA Grade Assessment:                            II - A patient with mild systemic disease. After                            reviewing the risks and benefits, the patient was                            deemed in satisfactory condition to undergo the  procedure.                           After obtaining informed consent, the colonoscope                            was passed under direct vision. Throughout the                            procedure, the patient's blood pressure, pulse, and                            oxygen saturations were monitored continuously. The                            Olympus CF-HQ190L [id number])  Colonoscope was                            introduced through the anus and advanced to the 3                            cm into the ileum. The colonoscopy was performed                            without difficulty. The patient tolerated the                            procedure. The quality of the bowel preparation was                            good. The terminal ileum, ileocecal valve,                            appendiceal orifice, and rectum were photographed. Scope In: 9:26:03 AM Scope Out: 9:41:00 AM Scope Withdrawal Time: 0 hours 11 minutes 37 seconds  Total Procedure Duration: 0 hours 14 minutes 57 seconds  Findings:                 The digital rectal exam findings include                            hemorrhoids. Pertinent negatives include no                            palpable rectal lesions.                           The terminal ileum and ileocecal valve appeared                            normal.                           Three sessile polyps were found in the sigmoid  colon and hepatic flexure. The polyps were 3 to 5                            mm in size. These polyps were removed with a cold                            snare. Resection and retrieval were complete.                           Multiple small-mouthed diverticula were found in                            the recto-sigmoid colon, sigmoid colon and                            descending colon.                           Normal mucosa was found in the entire colon                            otherwise.                           Non-bleeding non-thrombosed internal hemorrhoids                            were found during retroflexion, during perianal                            exam and during digital exam. The hemorrhoids were                            Grade II (internal hemorrhoids that prolapse but                            reduce spontaneously). Complications:            No immediate  complications. Estimated Blood Loss:     Estimated blood loss was minimal. Impression:               - Hemorrhoids found on digital rectal exam.                           - The examined portion of the ileum was normal.                           - Three 3 to 5 mm polyps in the sigmoid colon and                            at the hepatic flexure, removed with a cold snare.                            Resected and retrieved.                           -  Diverticulosis in the recto-sigmoid colon, in the                            sigmoid colon and in the descending colon.                           - Normal mucosa in the entire examined colon                            otherwise.                           - Non-bleeding non-thrombosed internal hemorrhoids. Recommendation:           - The patient will be observed post-procedure,                            until all discharge criteria are met.                           - Discharge patient to home.                           - Patient has a contact number available for                            emergencies. The signs and symptoms of potential                            delayed complications were discussed with the                            patient. Return to normal activities tomorrow.                            Written discharge instructions were provided to the                            patient.                           - High fiber diet.                           - Use FiberCon 1-2 tablets PO daily.                           - Continue present medications.                           - Await pathology results.                           - Repeat colonoscopy in 3 to 7 years for                            surveillance based on  pathology results and history                            of previous adenomatous colon polyps.                           - The findings and recommendations were discussed                            with the patient.                            - The findings and recommendations were discussed                            with the patient's family. Corliss Parish, MD 11/09/2022 9:46:06 AM

## 2022-11-09 NOTE — Progress Notes (Signed)
GASTROENTEROLOGY PROCEDURE H&P NOTE   Primary Care Physician: Ronnald Nian, MD  HPI: Adam Rosales is a 72 y.o. male who presents for Colonoscopy for surveillance of previous polyps.  Past Medical History:  Diagnosis Date   Dyslipidemia    History of kidney stones    Hyperlipidemia    Hypertension    Pre-diabetes    Past Surgical History:  Procedure Laterality Date   COLONOSCOPY  2009   Dr.Mann   INGUINAL HERNIA REPAIR Left    INGUINAL HERNIA REPAIR Right 12/16/2020   Procedure: RECURRENT OPEN RIGHT INGUINAL HERNIA REPAIR WITH MESH;  Surgeon: Quentin Ore, MD;  Location: [facility];  Service: General;  Laterality: Right;   SHX1 Right 05/01/2021   junctional nevus with moderate atypia   Current Outpatient Medications  Medication Sig Dispense Refill   atorvastatin (LIPITOR) 20 MG tablet Take 1 tablet (20 mg total) by mouth daily. 90 tablet 3   ibuprofen (ADVIL) 200 MG tablet Take 200 mg by mouth every 8 (eight) hours as needed (for pain.). (Patient not taking: Reported on 10/11/2022)     Multiple Vitamin (MULTIVITAMIN) capsule Take 1 capsule by mouth daily.     Current Facility-Administered Medications  Medication Dose Route Frequency Provider Last Rate Last Admin   0.9 %  sodium chloride infusion  500 mL Intravenous Once Mansouraty, Netty Starring., MD        Current Outpatient Medications:    atorvastatin (LIPITOR) 20 MG tablet, Take 1 tablet (20 mg total) by mouth daily., Disp: 90 tablet, Rfl: 3   ibuprofen (ADVIL) 200 MG tablet, Take 200 mg by mouth every 8 (eight) hours as needed (for pain.). (Patient not taking: Reported on 10/11/2022), Disp: , Rfl:    Multiple Vitamin (MULTIVITAMIN) capsule, Take 1 capsule by mouth daily., Disp: , Rfl:   Current Facility-Administered Medications:    0.9 %  sodium chloride infusion, 500 mL, Intravenous, Once, Mansouraty, Netty Starring., MD No Known Allergies Family History  Problem Relation Age of Onset   Heart disease Father  80   Heart attack Father    Heart disease Maternal Grandfather    Heart disease Paternal Grandfather    Lung cancer Paternal Grandfather    Prostate cancer Brother    Colon cancer Brother    Other Brother        Agent orange   Social History   Socioeconomic History   Marital status: Married    Spouse name: Not on file   Number of children: 2   Years of education: Not on file   Highest education level: Not on file  Occupational History   Not on file  Tobacco Use   Smoking status: Never   Smokeless tobacco: Never  Vaping Use   Vaping status: Never Used  Substance and Sexual Activity   Alcohol use: Yes    Alcohol/week: 1.0 standard drink of alcohol    Types: 1 Glasses of wine per week    Comment: less than 1 per day   Drug use: No   Sexual activity: Yes  Other Topics Concern   Not on file  Social History Narrative   Not on file   Social Determinants of Health   Financial Resource Strain: Low Risk  (11/02/2022)   Overall Financial Resource Strain (CARDIA)    Difficulty of Paying Living Expenses: Not hard at all  Food Insecurity: No Food Insecurity (11/02/2022)   Hunger Vital Sign    Worried About Running Out of Food in  the Last Year: Never true    Ran Out of Food in the Last Year: Never true  Transportation Needs: No Transportation Needs (11/02/2022)   PRAPARE - Administrator, Civil Service (Medical): No    Lack of Transportation (Non-Medical): No  Physical Activity: Sufficiently Active (11/02/2022)   Exercise Vital Sign    Days of Exercise per Week: 7 days    Minutes of Exercise per Session: 150+ min  Stress: No Stress Concern Present (11/02/2022)   [employer] of Occupational Health - Occupational Stress Questionnaire    Feeling of Stress : Not at all  Social Connections: Moderately Integrated (11/02/2022)   Social Connection and Isolation Panel [NHANES]    Frequency of Communication with Friends and Family: More than three times a week    Frequency of  Social Gatherings with Friends and Family: More than three times a week    Attends Religious Services: Never    Database administrator or Organizations: Yes    Attends Engineer, structural: More than 4 times per year    Marital Status: Married  Catering manager Violence: Not At Risk (11/02/2022)   Humiliation, Afraid, Rape, and Kick questionnaire    Fear of Current or Ex-Partner: No    Emotionally Abused: No    Physically Abused: No    Sexually Abused: No    Physical Exam: Today's Vitals   11/09/22 0834  BP: 120/71  Pulse: 75  SpO2: 98%  Weight: 200 lb (90.7 kg)  Height: 5\' 10"  (1.778 m)   Body mass index is 28.7 kg/m. GEN: NAD EYE: Sclerae anicteric ENT: MMM CV: Non-tachycardic GI: Soft, NT/ND NEURO:  Alert & Oriented x 3  Lab Results: No results for input(s): "WBC", "HGB", "HCT", "PLT" in the last 72 hours. BMET No results for input(s): "NA", "K", "CL", "CO2", "GLUCOSE", "BUN", "CREATININE", "CALCIUM" in the last 72 hours. LFT No results for input(s): "PROT", "ALBUMIN", "AST", "ALT", "ALKPHOS", "BILITOT", "BILIDIR", "IBILI" in the last 72 hours. PT/INR No results for input(s): "LABPROT", "INR" in the last 72 hours.   Impression / Plan: This is a 72 y.o.male who presents for Colonoscopy for surveillance of previous polyps.  The risks and benefits of endoscopic evaluation/treatment were discussed with the patient and/or family; these include but are not limited to the risk of perforation, infection, bleeding, missed lesions, lack of diagnosis, severe illness requiring hospitalization, as well as anesthesia and sedation related illnesses.  The patient's history has been reviewed, patient examined, no change in status, and deemed stable for procedure.  The patient and/or family is agreeable to proceed.    Corliss Parish, MD [facility] Office # [phone]

## 2022-11-09 NOTE — Progress Notes (Signed)
 Called to room to assist during endoscopic procedure.  Patient ID and intended procedure confirmed with present staff. Received instructions for my participation in the procedure from the performing physician.  

## 2022-11-10 ENCOUNTER — Telehealth: Payer: Self-pay | Admitting: *Deleted

## 2022-11-10 NOTE — Telephone Encounter (Signed)
  Follow up Call-     11/09/2022    8:46 AM  Call back number  Post procedure Call Back phone  # [phone]  Permission to leave phone message No     Patient questions:  Do you have a fever, pain , or abdominal swelling? No. Pain Score  0 *  Have you tolerated food without any problems? Yes.    Have you been able to return to your normal activities? Yes.    Do you have any questions about your discharge instructions: Diet   No. Medications  No. Follow up visit  No.  Do you have questions or concerns about your Care? No.  Actions: * If pain score is 4 or above: No action needed, pain <4.

## 2022-11-11 ENCOUNTER — Encounter: Payer: Self-pay | Admitting: Gastroenterology

## 2022-11-16 DIAGNOSIS — L570 Actinic keratosis: Secondary | ICD-10-CM | POA: Diagnosis not present

## 2022-11-16 DIAGNOSIS — D485 Neoplasm of uncertain behavior of skin: Secondary | ICD-10-CM | POA: Diagnosis not present

## 2022-11-16 DIAGNOSIS — L578 Other skin changes due to chronic exposure to nonionizing radiation: Secondary | ICD-10-CM | POA: Diagnosis not present

## 2022-11-16 DIAGNOSIS — L814 Other melanin hyperpigmentation: Secondary | ICD-10-CM | POA: Diagnosis not present

## 2022-11-16 DIAGNOSIS — D225 Melanocytic nevi of trunk: Secondary | ICD-10-CM | POA: Diagnosis not present

## 2022-11-16 DIAGNOSIS — Z09 Encounter for follow-up examination after completed treatment for conditions other than malignant neoplasm: Secondary | ICD-10-CM | POA: Diagnosis not present

## 2022-11-16 DIAGNOSIS — L821 Other seborrheic keratosis: Secondary | ICD-10-CM | POA: Diagnosis not present

## 2023-01-17 ENCOUNTER — Ambulatory Visit: Payer: Medicare PPO | Admitting: Family Medicine

## 2023-01-19 ENCOUNTER — Ambulatory Visit: Payer: Medicare PPO | Admitting: Family Medicine

## 2023-02-09 ENCOUNTER — Encounter: Payer: Self-pay | Admitting: Family Medicine

## 2023-02-09 ENCOUNTER — Ambulatory Visit (INDEPENDENT_AMBULATORY_CARE_PROVIDER_SITE_OTHER): Payer: Medicare PPO | Admitting: Family Medicine

## 2023-02-09 VITALS — BP 138/84 | HR 68 | Ht 71.5 in | Wt 202.6 lb

## 2023-02-09 DIAGNOSIS — I700 Atherosclerosis of aorta: Secondary | ICD-10-CM | POA: Diagnosis not present

## 2023-02-09 DIAGNOSIS — N401 Enlarged prostate with lower urinary tract symptoms: Secondary | ICD-10-CM | POA: Diagnosis not present

## 2023-02-09 DIAGNOSIS — Z0000 Encounter for general adult medical examination without abnormal findings: Secondary | ICD-10-CM

## 2023-02-09 DIAGNOSIS — Z860101 Personal history of adenomatous and serrated colon polyps: Secondary | ICD-10-CM

## 2023-02-09 DIAGNOSIS — Z23 Encounter for immunization: Secondary | ICD-10-CM

## 2023-02-09 DIAGNOSIS — R972 Elevated prostate specific antigen [PSA]: Secondary | ICD-10-CM | POA: Diagnosis not present

## 2023-02-09 DIAGNOSIS — E785 Hyperlipidemia, unspecified: Secondary | ICD-10-CM | POA: Diagnosis not present

## 2023-02-09 DIAGNOSIS — N200 Calculus of kidney: Secondary | ICD-10-CM | POA: Diagnosis not present

## 2023-02-09 DIAGNOSIS — Z8042 Family history of malignant neoplasm of prostate: Secondary | ICD-10-CM

## 2023-02-09 DIAGNOSIS — F419 Anxiety disorder, unspecified: Secondary | ICD-10-CM

## 2023-02-09 DIAGNOSIS — E669 Obesity, unspecified: Secondary | ICD-10-CM | POA: Diagnosis not present

## 2023-02-09 DIAGNOSIS — G479 Sleep disorder, unspecified: Secondary | ICD-10-CM | POA: Diagnosis not present

## 2023-02-09 DIAGNOSIS — R350 Frequency of micturition: Secondary | ICD-10-CM

## 2023-02-09 MED ORDER — ATORVASTATIN CALCIUM 20 MG PO TABS: 20.0000 mg | ORAL_TABLET | Freq: Every day | ORAL | 3 refills | Status: DC

## 2023-02-09 NOTE — Progress Notes (Signed)
Complete physical exam  Patient: Adam Rosales   DOB: [birth year]   72 y.o. Male  MRN: [id number]  Subjective:    No chief complaint on file.   Adam Rosales is a 72 y.o. male who presents today for an annual wellness visit andcomplete physical exam. He reports consuming a general diet. Home exercise routine includes walking 2 hrs per day. He generally feels well. He reports sleeping fairly well. He has lost a significant amount of weight making diet and exercise changes and feels very good about.  He is in the process of retiring which will probably be sometime next year.  He and his wife are starting to travel.  He continues on Lipitor and having no difficulty with that.  He did have sleep issues in the past but this seems to be doing much better.  His main stressor now is dealing with a daughter that had a special needs.  He seems to have this under control but does admit to having anxiety over her wellbeing when he and the wife are no longer around.  He does have a history of adenomatous colonic polyp and is scheduled for repeat colonoscopy in a few years.  Most recent fall risk assessment:    11/02/2022    1:33 PM  Fall Risk   Falls in the past year? 0  Number falls in past yr: 0  Injury with Fall? 0  Risk for fall due to : Medication side effect  Follow up Falls prevention discussed;Falls evaluation completed     Most recent depression screenings:    11/02/2022    1:34 PM 01/11/2022   10:30 AM  PHQ 2/9 Scores  PHQ - 2 Score 0 0  PHQ- 9 Score 0     Vision:Within last year and Dental: No current dental problems and Last dental visit: August 2024    Patient Care Team: Ronnald Nian, MD as PCP - General (Family Medicine)   Outpatient Medications Prior to Visit  Medication Sig   Multiple Vitamin (MULTIVITAMIN) capsule Take 1 capsule by mouth daily.   ibuprofen (ADVIL) 200 MG tablet Take 200 mg by mouth every 8 (eight) hours as needed (for pain.). (Patient not taking:  Reported on 10/11/2022)   [DISCONTINUED] atorvastatin (LIPITOR) 20 MG tablet Take 1 tablet (20 mg total) by mouth daily.   No facility-administered medications prior to visit.    Review of Systems  All other systems reviewed and are negative.  Family and social history as well as health maintenance and immunizations was reviewed.       Objective:       Physical Exam  Alert and in no distress. Tympanic membranes and canals are normal. Pharyngeal area is normal. Neck is supple without adenopathy or thyromegaly. Cardiac exam shows a regular sinus rhythm without murmurs or gallops. Lungs are clear to auscultation.      Assessment & Plan:    Routine general medical examination at a health care facility - Plan: CBC with Differential/Platelet, Comprehensive metabolic panel, Lipid panel  Sleep disturbance  Renal stone  Obesity (BMI 30-39.9) - Plan: CBC with Differential/Platelet, Comprehensive metabolic panel, Lipid panel  Hyperlipidemia with target LDL less than 100 - Plan: Lipid panel, atorvastatin (LIPITOR) 20 MG tablet  Hx of adenomatous colonic polyps  Family history of prostate cancer - Plan: PSA  Benign prostatic hyperplasia with urinary frequency  Aortic atherosclerosis (HCC) - Plan: Lipid panel, atorvastatin (LIPITOR) 20 MG tablet  Anxiety  Immunization History  Administered Date(s) Administered   DTaP 09/27/1995   Fluad Quad(high Dose 65+) 12/20/2018, 01/11/2022   Influenza Split 02/03/2012   Influenza, High Dose Seasonal PF 01/31/2017, 12/30/2022   Influenza, Quadrivalent, Recombinant, Inj, Pf 01/22/2018   Influenza-Unspecified 02/16/2016, 01/22/2018, 01/01/2021   Moderna Sars-Covid-2 Vaccination 06/07/2019, 07/02/2019   PFIZER Comirnaty(Gray Top)Covid-19 Tri-Sucrose Vaccine 12/30/2022   PFIZER(Purple Top)SARS-COV-2 Vaccination 01/18/2020, 07/30/2020   PPD Test 06/06/1995   Pfizer Covid-19 Vaccine Bivalent Booster 58yrs & up 01/01/2021   Pneumococcal  Conjugate-13 01/22/2018   Pneumococcal Polysaccharide-23 08/11/2006   Pneumococcal-Unspecified 01/22/2018   Tdap 08/11/2006, 07/18/2017   Zoster Recombinant(Shingrix) 01/31/2017, 07/18/2017   Zoster, Live 07/05/2011    Health Maintenance  Topic Date Due   COVID-19 Vaccine (7 - 2023-24 season) 02/24/2023   Medicare Annual Wellness (AWV)  11/02/2023   Colonoscopy  11/08/2025   DTaP/Tdap/Td (4 - Td or Tdap) 07/19/2027   INFLUENZA VACCINE  Completed   Hepatitis C Screening  Completed   Zoster Vaccines- Shingrix  Completed   HPV VACCINES  Aged Out   Pneumonia Vaccine 100+ Years old  Discontinued    Discussed discussed the treatment of kidney stone and need to call me when he has symptoms so I can call him and a pain med and then set up for CT scans.  He will continue on his Lipitor.  Encouraged him continue to maintain or even lose a little bit more weight which she does plan on doing.   Problem List Items Addressed This Visit     Anxiety   Aortic atherosclerosis (HCC)   Relevant Medications   atorvastatin (LIPITOR) 20 MG tablet   Other Relevant Orders   Lipid panel   Benign prostatic hyperplasia with urinary frequency   Family history of prostate cancer   Relevant Orders   PSA   Hx of adenomatous colonic polyps   Hyperlipidemia with target LDL less than 100   Relevant Medications   atorvastatin (LIPITOR) 20 MG tablet   Other Relevant Orders   Lipid panel   Obesity (BMI 30-39.9)   Relevant Orders   CBC with Differential/Platelet   Comprehensive metabolic panel   Lipid panel   Renal stone   Sleep disturbance   Other Visit Diagnoses     Routine general medical examination at a health care facility    -  Primary   Relevant Orders   CBC with Differential/Platelet   Comprehensive metabolic panel   Lipid panel      Follow-up 1 year   Sharlot Gowda, MD

## 2023-02-10 LAB — LIPID PANEL
Cholesterol, Total: 124 mg/dL (ref 100–199)
HDL: 51 mg/dL (ref >39)
LDL CALC COMMENT:: 2.4 ratio (ref 0.0–5.0)
LDL Chol Calc (NIH): 56 mg/dL (ref 0–99)
Triglycerides: 91 mg/dL (ref 0–149)
VLDL Cholesterol Cal: 17 mg/dL (ref 5–40)

## 2023-02-10 LAB — CBC WITH DIFFERENTIAL/PLATELET
Basophils Absolute: 0 10*3/uL (ref 0.0–0.2)
Basos: 1 %
EOS (ABSOLUTE): 0.2 10*3/uL (ref 0.0–0.4)
Eos: 4 %
Hematocrit: 51.3 % — ABNORMAL HIGH (ref 37.5–51.0)
Hemoglobin: 17.3 g/dL (ref 13.0–17.7)
Immature Grans (Abs): 0 10*3/uL (ref 0.0–0.1)
Immature Granulocytes: 0 %
Lymphocytes Absolute: 1.8 10*3/uL (ref 0.7–3.1)
Lymphs: 37 %
MCH: 32.6 pg (ref 26.6–33.0)
MCHC: 33.7 g/dL (ref 31.5–35.7)
MCV: 97 fL (ref 79–97)
Monocytes Absolute: 0.4 10*3/uL (ref 0.1–0.9)
Monocytes: 8 %
Neutrophils Absolute: 2.4 10*3/uL (ref 1.4–7.0)
Neutrophils: 50 %
Platelets: 235 10*3/uL (ref 150–450)
RBC: 5.31 x10E6/uL (ref 4.14–5.80)
RDW: 12.6 % (ref 11.6–15.4)
WBC: 4.8 10*3/uL (ref 3.4–10.8)

## 2023-02-10 LAB — COMPREHENSIVE METABOLIC PANEL
ALT: 19 IU/L (ref 0–44)
AST: 24 IU/L (ref 0–40)
Albumin: 4.7 g/dL (ref 3.8–4.8)
Alkaline Phosphatase: 58 IU/L (ref 44–121)
BUN/Creatinine Ratio: 12 (ref 10–24)
BUN: 11 mg/dL (ref 8–27)
Bilirubin Total: 0.8 mg/dL (ref 0.0–1.2)
CO2: 25 mmol/L (ref 20–29)
Calcium: 9.7 mg/dL (ref 8.6–10.2)
Chloride: 102 mmol/L (ref 96–106)
Creatinine, Ser: 0.89 mg/dL (ref 0.76–1.27)
Globulin, Total: 2.3 g/dL (ref 1.5–4.5)
Glucose: 106 mg/dL — ABNORMAL HIGH (ref 70–99)
Potassium: 4.4 mmol/L (ref 3.5–5.2)
Sodium: 143 mmol/L (ref 134–144)
Total Protein: 7 g/dL (ref 6.0–8.5)
eGFR: 92 mL/min/{1.73_m2} (ref >59)

## 2023-02-10 LAB — PSA: Prostate Specific Ag, Serum: 5.7 ng/mL — ABNORMAL HIGH (ref 0.0–4.0)

## 2023-02-11 ENCOUNTER — Encounter: Payer: Self-pay | Admitting: Family Medicine

## 2023-02-11 DIAGNOSIS — R972 Elevated prostate specific antigen [PSA]: Secondary | ICD-10-CM

## 2023-02-15 LAB — PSA TOTAL (REFLEX TO FREE): PSA, Total: 5.7 ng/mL — ABNORMAL HIGH (ref 0.0–4.0)

## 2023-02-15 LAB — .%FPSA REFLEX 480852
% Free PSA: 12.6 %
PSA, FREE: 0.72 ng/mL

## 2023-02-15 LAB — SPECIMEN STATUS REPORT

## 2023-02-21 DIAGNOSIS — R972 Elevated prostate specific antigen [PSA]: Secondary | ICD-10-CM | POA: Diagnosis not present

## 2023-02-21 DIAGNOSIS — R350 Frequency of micturition: Secondary | ICD-10-CM | POA: Diagnosis not present

## 2023-02-22 ENCOUNTER — Other Ambulatory Visit: Payer: Self-pay | Admitting: Adult Health

## 2023-02-22 DIAGNOSIS — R972 Elevated prostate specific antigen [PSA]: Secondary | ICD-10-CM

## 2023-03-22 ENCOUNTER — Encounter: Payer: Self-pay | Admitting: Adult Health

## 2023-04-18 ENCOUNTER — Ambulatory Visit
Admission: RE | Admit: 2023-04-18 | Discharge: 2023-04-18 | Disposition: A | Discharge status: Home / Self Care | Payer: Medicare PPO | Source: Ambulatory Visit | Attending: Adult Health | Admitting: Adult Health

## 2023-04-18 DIAGNOSIS — R972 Elevated prostate specific antigen [PSA]: Secondary | ICD-10-CM

## 2023-04-18 MED ORDER — GADOPICLENOL 0.5 MMOL/ML IV SOLN
10.0000 mL | Freq: Once | INTRAVENOUS | Status: AC | PRN
  Administered 2023-04-18: 10 mL via INTRAVENOUS

## 2023-04-28 DIAGNOSIS — R972 Elevated prostate specific antigen [PSA]: Secondary | ICD-10-CM | POA: Diagnosis not present

## 2023-06-06 DIAGNOSIS — R972 Elevated prostate specific antigen [PSA]: Secondary | ICD-10-CM | POA: Diagnosis not present

## 2023-06-06 DIAGNOSIS — C61 Malignant neoplasm of prostate: Secondary | ICD-10-CM | POA: Diagnosis not present

## 2023-06-06 HISTORY — PX: PROSTATE BIOPSY: SHX241

## 2023-06-13 DIAGNOSIS — C61 Malignant neoplasm of prostate: Secondary | ICD-10-CM | POA: Diagnosis not present

## 2023-06-20 ENCOUNTER — Encounter: Payer: Self-pay | Admitting: Radiation Oncology

## 2023-06-20 NOTE — Progress Notes (Signed)
 GU Location of Tumor / Histology: Prostate Ca  If Prostate Cancer, Gleason Score is (3 + 3) and PSA is (4.93 on 02/22/2023)    Adam Rosales presented as referral from Dr. Rhoderick Moody for elevated PSA.  Biopsies     04/18/2023 [name], NP MR Prostate with/without Contrast CLINICAL DATA: Elevated PSA level of 4.93 over the last 10 years.  IMPRESSION: 1. PI-RADS category 4 lesion of the right anterior peripheral zone at the apex. Targeting data sent to [facility]. 2. Mild prostatomegaly and benign prostatic hypertrophy.   Past/Anticipated interventions by urology, if any: NA  Past/Anticipated interventions by medical oncology, if any: NA  Weight changes, if any: No  IPSS:  18 SHIM:  18  Bowel/Bladder complaints, if any:  No  Nausea/Vomiting, if any: No  Pain issues, if any:  0/10  SAFETY ISSUES: Prior radiation? No Pacemaker/ICD? No Possible current pregnancy? Male Is the patient on methotrexate? No  Current Complaints / other details:

## 2023-06-21 ENCOUNTER — Encounter: Payer: Self-pay | Admitting: Internal Medicine

## 2023-06-26 ENCOUNTER — Encounter: Payer: Self-pay | Admitting: Urology

## 2023-06-26 DIAGNOSIS — C61 Malignant neoplasm of prostate: Secondary | ICD-10-CM | POA: Diagnosis not present

## 2023-06-26 DIAGNOSIS — Z191 Hormone sensitive malignancy status: Secondary | ICD-10-CM | POA: Diagnosis not present

## 2023-06-26 NOTE — Progress Notes (Signed)
 Radiation Oncology         [phone] ________________________________  Initial Outpatient Consultation  Name: Adam Rosales MRN: [id number]  Date: 06/27/2023  DOB: 10-17-1950  [id number], Everardo All, MD  Shelly Rubenstein*   REFERRING PHYSICIAN: Shelly Rubenstein*  DIAGNOSIS: 73 y.o. gentleman with Stage T1c adenocarcinoma of the prostate with Gleason score of 3+3, and PSA of 4.93.  No diagnosis found.  HISTORY OF PRESENT ILLNESS: Adam Rosales is a 73 y.o. male with a diagnosis of prostate cancer. He was previously seen by Dr. Liliane Shi in 2022 for a scrotal abscess. More recently, he was noted to have an elevated PSA of 5.7 by his primary care physician, Dr. Susann Givens.  Accordingly, he was referred for evaluation in urology by [name], NP on 02/21/23,  digital rectal examination performed at that time showed no nodules or induration. A repeat PSA obtained that day showed a slight drop but persistent elevation at 4.93. He underwent prostate MRI on 04/18/23 showing: PI-RADS 4 lesion of right anterior peripheral zone at apex. The patient proceeded to MRI fusion biopsy of the prostate on 06/06/23 under Dr. Liliane Shi.  The prostate volume measured 47 cc.  Out of 15 core biopsies, 2 were positive.  The maximum Gleason score was 3+3, and this was seen in right apex (10% involvement) and right mid lateral (5% involvement). Of note, all three samples from the ROI were benign.  The patient reviewed the biopsy results with his urologist and he has kindly been referred today for discussion of potential radiation treatment options.   PREVIOUS RADIATION THERAPY: No  PAST MEDICAL HISTORY:  Past Medical History:  Diagnosis Date   Cellulitis of buttock 2022   Dyslipidemia    Elevated PSA    History of kidney stones    Hyperlipidemia    Hypertension    Pre-diabetes    Scrotal abscess 2022      PAST SURGICAL HISTORY: Past Surgical History:  Procedure Laterality Date   COLONOSCOPY   2009   Dr.Mann   INGUINAL HERNIA REPAIR Left    INGUINAL HERNIA REPAIR Right 12/16/2020   Procedure: RECURRENT OPEN RIGHT INGUINAL HERNIA REPAIR WITH MESH;  Surgeon: Stechschulte, Hyman Hopes, MD;  Location: [facility];  Service: General;  Laterality: Right;   PROSTATE BIOPSY  06/06/2023   SHX1 Right 05/01/2021   junctional nevus with moderate atypia    FAMILY HISTORY:  Family History  Problem Relation Age of Onset   Arthritis Mother    Heart disease Father 1   Heart attack Father    Prostate cancer Brother    Colon cancer Brother    Other Brother        Agent orange   Heart disease Maternal Grandfather    Heart disease Paternal Grandfather    Lung cancer Paternal Grandfather     SOCIAL HISTORY:  Social History   Socioeconomic History   Marital status: Married    Spouse name: Not on file   Number of children: 2   Years of education: Not on file   Highest education level: Not on file  Occupational History   Not on file  Tobacco Use   Smoking status: Never   Smokeless tobacco: Never  Vaping Use   Vaping status: Never Used  Substance and Sexual Activity   Alcohol use: Yes    Alcohol/week: 1.0 standard drink of alcohol    Types: 1 Glasses of wine per week    Comment: less than 1 per day  Drug use: No   Sexual activity: Yes  Other Topics Concern   Not on file  Social History Narrative   Not on file   Social Drivers of Health   Financial Resource Strain: Low Risk  (02/09/2023)   Overall Financial Resource Strain (CARDIA)    Difficulty of Paying Living Expenses: Not very hard  Food Insecurity: No Food Insecurity (02/09/2023)   Hunger Vital Sign    Worried About Running Out of Food in the Last Year: Never true    Ran Out of Food in the Last Year: Never true  Transportation Needs: No Transportation Needs (02/09/2023)   PRAPARE - Administrator, Civil Service (Medical): No    Lack of Transportation (Non-Medical): No  Physical Activity: Sufficiently Active  (02/09/2023)   Exercise Vital Sign    Days of Exercise per Week: 7 days    Minutes of Exercise per Session: 120 min  Stress: Stress Concern Present (02/09/2023)   [employer] of Occupational Health - Occupational Stress Questionnaire    Feeling of Stress : Rather much  Social Connections: Moderately Integrated (02/09/2023)   Social Connection and Isolation Panel [NHANES]    Frequency of Communication with Friends and Family: Twice a week    Frequency of Social Gatherings with Friends and Family: More than three times a week    Attends Religious Services: Never    Database administrator or Organizations: Yes    Attends Engineer, structural: More than 4 times per year    Marital Status: Married  Catering manager Violence: Not At Risk (02/09/2023)   Humiliation, Afraid, Rape, and Kick questionnaire    Fear of Current or Ex-Partner: No    Emotionally Abused: No    Physically Abused: No    Sexually Abused: No    ALLERGIES: Tamsulosin  MEDICATIONS:  Current Outpatient Medications  Medication Sig Dispense Refill   FLUZONE HIGH-DOSE 0.5 ML injection      SPIKEVAX syringe      atorvastatin (LIPITOR) 20 MG tablet Take 1 tablet (20 mg total) by mouth daily. 90 tablet 3   ibuprofen (ADVIL) 200 MG tablet Take 200 mg by mouth every 8 (eight) hours as needed (for pain.). (Patient not taking: Reported on 10/11/2022)     Multiple Vitamin (MULTIVITAMIN) capsule Take 1 capsule by mouth daily.     No current facility-administered medications for this encounter.    REVIEW OF SYSTEMS:  On review of systems, the patient reports that he is doing well overall. He denies any chest pain, shortness of breath, cough, fevers, chills, night sweats, unintended weight changes. He denies any bowel disturbances, and denies abdominal pain, nausea or vomiting. He denies any new musculoskeletal or joint aches or pains. His IPSS was ***, indicating *** urinary symptoms. His SHIM was ***, indicating he  {does not have/has mild/moderate/severe} erectile dysfunction. A complete review of systems is obtained and is otherwise negative.    PHYSICAL EXAM:  Wt Readings from Last 3 Encounters:  02/09/23 202 lb 9.6 oz (91.9 kg)  11/09/22 200 lb (90.7 kg)  11/02/22 200 lb (90.7 kg)   Temp Readings from Last 3 Encounters:  11/09/22 98 F (36.7 C)  01/26/22 98 F (36.7 C) (Oral)  01/11/22 98.2 F (36.8 C)   BP Readings from Last 3 Encounters:  02/09/23 138/84  11/09/22 130/76  10/15/22 132/84   Pulse Readings from Last 3 Encounters:  02/09/23 68  11/09/22 (!) 51  10/15/22 89    /10  In general this is a well appearing *** male in no acute distress. He's alert and oriented x4 and appropriate throughout the examination. Cardiopulmonary assessment is negative for acute distress, and he exhibits normal effort.     KPS = ***  100 - Normal; no complaints; no evidence of disease. 90   - Able to carry on normal activity; minor signs or symptoms of disease. 80   - Normal activity with effort; some signs or symptoms of disease. 71   - Cares for self; unable to carry on normal activity or to do active work. 60   - Requires occasional assistance, but is able to care for most of his personal needs. 50   - Requires considerable assistance and frequent medical care. 40   - Disabled; requires special care and assistance. 30   - Severely disabled; hospital admission is indicated although death not imminent. 20   - Very sick; hospital admission necessary; active supportive treatment necessary. 10   - Moribund; fatal processes progressing rapidly. 0     - Dead  Karnofsky DA, Abelmann WH, Craver LS and [facility] [phone]) The use of the nitrogen mustards in the palliative treatment of carcinoma: with particular reference to bronchogenic carcinoma Cancer 1 634-56  LABORATORY DATA:  Lab Results  Component Value Date   WBC 4.8 02/09/2023   HGB 17.3 02/09/2023   HCT 51.3 (H) 02/09/2023   MCV 97  02/09/2023   PLT 235 02/09/2023   Lab Results  Component Value Date   NA 143 02/09/2023   K 4.4 02/09/2023   CL 102 02/09/2023   CO2 25 02/09/2023   Lab Results  Component Value Date   ALT 19 02/09/2023   AST 24 02/09/2023   ALKPHOS 58 02/09/2023   BILITOT 0.8 02/09/2023     RADIOGRAPHY: No results found.    IMPRESSION/PLAN: 1. 73 y.o. gentleman with Stage T1c adenocarcinoma of the prostate with Gleason Score of 3+3, and PSA of 4.93. We discussed the patient's workup and outlined the nature of prostate cancer in this setting. The patient's T stage, Gleason's score, and PSA put him into the low risk group. Accordingly, he is eligible for a variety of potential treatment options including active surveillance, brachytherapy, 5.5 weeks of external radiation, or prostatectomy. We discussed the available radiation techniques, and focused on the details and logistics of delivery. We discussed and outlined the risks, benefits, short and long-term effects associated with radiotherapy and compared and contrasted these with prostatectomy. We discussed the role of SpaceOAR gel in reducing the rectal toxicity associated with radiotherapy.  He appears to have a good understanding of his disease and our treatment recommendations which are of curative intent.  He was encouraged to ask questions that were answered to his stated satisfaction.  At the conclusion of our conversation, the patient is interested in moving forward with ***.  We personally spent *** minutes in this encounter including chart review, reviewing radiological studies, meeting face-to-face with the patient, entering orders and completing documentation.    Marguarite Arbour, PA-C    Margaretmary Dys, MD  [facility]  Radiation Oncology Direct Dial: [phone]  Fax: [phone] [email or web address]  Skype  [email or web address]   This document serves as a record of services personally performed by Margaretmary Dys, MD and Marcello Fennel,  PA-C. It was created on their behalf by Mickie Bail, a trained medical scribe. The creation of this record is based on the scribe's personal observations and the provider's statements to them. This  document has been checked and approved by the attending provider.

## 2023-06-27 ENCOUNTER — Ambulatory Visit
Admission: RE | Admit: 2023-06-27 | Discharge: 2023-06-27 | Disposition: A | Discharge status: Home / Self Care | Payer: Medicare PPO | Source: Ambulatory Visit | Attending: Radiation Oncology | Admitting: Radiation Oncology

## 2023-06-27 ENCOUNTER — Encounter: Payer: Self-pay | Admitting: Radiation Oncology

## 2023-06-27 VITALS — BP 144/94 | HR 65 | Temp 97.8°F | Resp 18 | Ht 71.5 in | Wt 206.4 lb

## 2023-06-27 DIAGNOSIS — Z800 Family history of malignant neoplasm of digestive organs: Secondary | ICD-10-CM | POA: Insufficient documentation

## 2023-06-27 DIAGNOSIS — E785 Hyperlipidemia, unspecified: Secondary | ICD-10-CM | POA: Diagnosis not present

## 2023-06-27 DIAGNOSIS — Z801 Family history of malignant neoplasm of trachea, bronchus and lung: Secondary | ICD-10-CM | POA: Diagnosis not present

## 2023-06-27 DIAGNOSIS — Z8042 Family history of malignant neoplasm of prostate: Secondary | ICD-10-CM | POA: Insufficient documentation

## 2023-06-27 DIAGNOSIS — Z79899 Other long term (current) drug therapy: Secondary | ICD-10-CM | POA: Diagnosis not present

## 2023-06-27 DIAGNOSIS — I10 Essential (primary) hypertension: Secondary | ICD-10-CM | POA: Insufficient documentation

## 2023-06-27 DIAGNOSIS — C61 Malignant neoplasm of prostate: Secondary | ICD-10-CM

## 2023-06-27 DIAGNOSIS — Z87442 Personal history of urinary calculi: Secondary | ICD-10-CM | POA: Diagnosis not present

## 2023-06-27 HISTORY — DX: Elevated prostate specific antigen (PSA): R97.20

## 2023-06-27 NOTE — Progress Notes (Signed)
 Introduced myself to the patient as the prostate nurse navigator.  No barriers to care identified at this time.  He is here to discuss his radiation treatment options and will proceed with active surveillance.  I gave him my business card and asked him to call me with questions or concerns.  Verbalized understanding.

## 2023-07-14 DIAGNOSIS — L814 Other melanin hyperpigmentation: Secondary | ICD-10-CM | POA: Diagnosis not present

## 2023-07-14 DIAGNOSIS — L578 Other skin changes due to chronic exposure to nonionizing radiation: Secondary | ICD-10-CM | POA: Diagnosis not present

## 2023-07-14 DIAGNOSIS — D225 Melanocytic nevi of trunk: Secondary | ICD-10-CM | POA: Diagnosis not present

## 2023-07-14 DIAGNOSIS — L821 Other seborrheic keratosis: Secondary | ICD-10-CM | POA: Diagnosis not present

## 2023-07-14 DIAGNOSIS — L570 Actinic keratosis: Secondary | ICD-10-CM | POA: Diagnosis not present

## 2023-08-25 ENCOUNTER — Other Ambulatory Visit: Payer: Self-pay | Admitting: Urology

## 2023-08-25 DIAGNOSIS — C61 Malignant neoplasm of prostate: Secondary | ICD-10-CM

## 2023-09-01 ENCOUNTER — Encounter: Payer: Self-pay | Admitting: Urology

## 2023-10-03 ENCOUNTER — Ambulatory Visit
Admission: RE | Admit: 2023-10-03 | Discharge: 2023-10-03 | Disposition: A | Discharge status: Home / Self Care | Source: Ambulatory Visit | Attending: Urology | Admitting: Urology

## 2023-10-03 DIAGNOSIS — C61 Malignant neoplasm of prostate: Secondary | ICD-10-CM

## 2023-10-03 MED ORDER — GADOPICLENOL 0.5 MMOL/ML IV SOLN
10.0000 mL | Freq: Once | INTRAVENOUS | Status: AC | PRN
  Administered 2023-10-03: 10 mL via INTRAVENOUS

## 2023-10-18 DIAGNOSIS — L821 Other seborrheic keratosis: Secondary | ICD-10-CM | POA: Diagnosis not present

## 2023-10-18 DIAGNOSIS — L570 Actinic keratosis: Secondary | ICD-10-CM | POA: Diagnosis not present

## 2023-10-18 DIAGNOSIS — L814 Other melanin hyperpigmentation: Secondary | ICD-10-CM | POA: Diagnosis not present

## 2023-10-18 DIAGNOSIS — Z09 Encounter for follow-up examination after completed treatment for conditions other than malignant neoplasm: Secondary | ICD-10-CM | POA: Diagnosis not present

## 2023-10-18 DIAGNOSIS — L738 Other specified follicular disorders: Secondary | ICD-10-CM | POA: Diagnosis not present

## 2023-10-18 DIAGNOSIS — D1801 Hemangioma of skin and subcutaneous tissue: Secondary | ICD-10-CM | POA: Diagnosis not present

## 2023-11-08 ENCOUNTER — Ambulatory Visit: Payer: Medicare PPO

## 2023-11-08 ENCOUNTER — Telehealth: Payer: Self-pay

## 2023-11-08 DIAGNOSIS — Z0000 Encounter for general adult medical examination without abnormal findings: Secondary | ICD-10-CM | POA: Diagnosis not present

## 2023-11-08 NOTE — Patient Instructions (Signed)
 Adam Rosales , Thank you for taking time out of your busy schedule to complete your Annual Wellness Visit with me. I enjoyed our conversation and look forward to speaking with you again next year. I, as well as your care team,  appreciate your ongoing commitment to your health goals. Please review the following plan we discussed and let me know if I can assist you in the future. Your Game plan/ To Do List    Referrals: If you haven't heard from the office you've been referred to, please reach out to them at the phone provided.  N/a Follow up Visits: Next Medicare AWV with our clinical staff: 11/13/2024 at 1:30   Have you seen your provider in the last 6 months (3 months if uncontrolled diabetes)? No Next Office Visit with your provider: 02/14/2024 at 3:15  Clinician Recommendations:  Aim for 30 minutes of exercise or brisk walking, 6-8 glasses of water, and 5 servings of fruits and vegetables each day.       This is a list of the screening recommended for you and due dates:  Health Maintenance  Topic Date Due   COVID-19 Vaccine (7 - 2024-25 season) 02/24/2023   Flu Shot  11/25/2023   Medicare Annual Wellness Visit  11/07/2024   Colon Cancer Screening  11/08/2025   DTaP/Tdap/Td vaccine (4 - Td or Tdap) 07/19/2027   Hepatitis C Screening  Completed   Zoster (Shingles) Vaccine  Completed   Hepatitis B Vaccine  Aged Out   HPV Vaccine  Aged Out   Meningitis B Vaccine  Aged Out   Pneumococcal Vaccine for age over 10  Discontinued    Advanced directives: (Copy Requested) Please bring a copy of your health care power of attorney and living will to the office to be added to your chart at your convenience. You can mail to [facility] [address] [place], [place] [address] or email to [email or web address] Advance Care Planning is important because it:  [x]  Makes sure you receive the medical care that is consistent with your values, goals, and preferences  [x]  It  provides guidance to your family and loved ones and reduces their decisional burden about whether or not they are making the right decisions based on your wishes.  Follow the link provided in your after visit summary or read over the paperwork we have mailed to you to help you started getting your Advance Directives in place. If you need assistance in completing these, please reach out to us  so that we can help you!  See attachments for Preventive Care and Fall Prevention Tips.

## 2023-11-08 NOTE — Progress Notes (Signed)
 Subjective:   NYKO GELL is a 73 y.o. who presents for a Medicare Wellness preventive visit.  As a reminder, Annual Wellness Visits don't include a physical exam, and some assessments may be limited, especially if this visit is performed virtually. We may recommend an in-person follow-up visit with your provider if needed.  Visit Complete: Virtual I connected with  Reyes JAYSON Dayhoff on 11/08/23 by a audio enabled telemedicine application and verified that I am speaking with the correct person using two identifiers.  Patient Location: Home  Provider Location: Office/Clinic  I discussed the limitations of evaluation and management by telemedicine. The patient expressed understanding and agreed to proceed.  Vital Signs: Because this visit was a virtual/telehealth visit, some criteria may be missing or patient reported. Any vitals not documented were not able to be obtained and vitals that have been documented are patient reported.  VideoError- Librarian, academic were attempted between this provider and patient, however failed, due to patient having technical difficulties OR patient did not have access to video capability.  We continued and completed visit with audio only.   Persons Participating in Visit: Patient.  AWV Questionnaire: Yes: Patient Medicare AWV questionnaire was completed by the patient on 11/07/2023; I have confirmed that all information answered by patient is correct and no changes since this date.  Cardiac Risk Factors include: advanced age (>18men, >38 women);dyslipidemia;male gender     Objective:    Today's Vitals   There is no height or weight on file to calculate BMI.     11/08/2023    3:03 PM 06/27/2023    9:36 AM 11/02/2022    1:32 PM 01/26/2022    4:52 PM 01/11/2022   10:28 AM 01/02/2021    1:43 PM 12/12/2020    9:15 AM  Advanced Directives  Does Patient Have a Medical Advance Directive? Yes Yes Yes No Yes Yes Yes  Type of  Estate agent of [place];Living will Living will;Healthcare Power of [employer] Power of [place];Living will  Healthcare Power of [place];Living will Living will Living will;Healthcare Power of Attorney  Does patient want to make changes to medical advance directive?     No - Patient declined No - Patient declined   Copy of Healthcare Power of Attorney in Chart? No - copy requested  No - copy requested  No - copy requested      Current Medications (verified) Outpatient Encounter Medications as of 11/08/2023  Medication Sig   atorvastatin  (LIPITOR) 20 MG tablet Take 1 tablet (20 mg total) by mouth daily.   ibuprofen  (ADVIL ) 200 MG tablet Take 200 mg by mouth every 8 (eight) hours as needed (for pain.).   Multiple Vitamin (MULTIVITAMIN) capsule Take 1 capsule by mouth daily.   FLUZONE HIGH-DOSE 0.5 ML injection    SPIKEVAX syringe    No facility-administered encounter medications on file as of 11/08/2023.    Allergies (verified) Tamsulosin    History: Past Medical History:  Diagnosis Date   Cellulitis of buttock 2022   Dyslipidemia    Elevated PSA    History of kidney stones    Hyperlipidemia    Hypertension    Pre-diabetes    Scrotal abscess 2022   Past Surgical History:  Procedure Laterality Date   COLONOSCOPY  2009   Dr.Mann   INGUINAL HERNIA REPAIR Left    INGUINAL HERNIA REPAIR Right 12/16/2020   Procedure: RECURRENT OPEN RIGHT INGUINAL HERNIA REPAIR WITH MESH;  Surgeon: Lyndel Deward PARAS, MD;  Location: [facility];  Service: General;  Laterality: Right;   PROSTATE BIOPSY  06/06/2023   SHX1 Right 05/01/2021   junctional nevus with moderate atypia   Family History  Problem Relation Age of Onset   Arthritis Mother    Heart disease Father 67   Heart attack Father    Prostate cancer Brother    Colon cancer Brother    Other Brother        Agent orange   Heart disease Maternal Grandfather    Heart disease Paternal Grandfather    Lung  cancer Paternal Grandfather    Social History   Socioeconomic History   Marital status: Married    Spouse name: Not on file   Number of children: 2   Years of education: Not on file   Highest education level: Doctorate  Occupational History   Not on file  Tobacco Use   Smoking status: Never   Smokeless tobacco: Never  Vaping Use   Vaping status: Never Used  Substance and Sexual Activity   Alcohol use: Yes    Alcohol/week: 1.0 standard drink of alcohol    Types: 1 Glasses of wine per week    Comment: less than 1 per day   Drug use: No   Sexual activity: Yes  Other Topics Concern   Not on file  Social History Narrative   Not on file   Social Drivers of Health   Financial Resource Strain: Low Risk  (11/07/2023)   Overall Financial Resource Strain (CARDIA)    Difficulty of Paying Living Expenses: Not hard at all  Food Insecurity: No Food Insecurity (11/07/2023)   Hunger Vital Sign    Worried About Running Out of Food in the Last Year: Never true    Ran Out of Food in the Last Year: Never true  Transportation Needs: No Transportation Needs (11/07/2023)   PRAPARE - Administrator, Civil Service (Medical): No    Lack of Transportation (Non-Medical): No  Physical Activity: Sufficiently Active (11/07/2023)   Exercise Vital Sign    Days of Exercise per Week: 7 days    Minutes of Exercise per Session: 90 min  Stress: No Stress Concern Present (11/07/2023)   [employer] of Occupational Health - Occupational Stress Questionnaire    Feeling of Stress: Only a little  Social Connections: Moderately Integrated (11/07/2023)   Social Connection and Isolation Panel    Frequency of Communication with Friends and Family: Twice a week    Frequency of Social Gatherings with Friends and Family: Twice a week    Attends Religious Services: Never    Database administrator or Organizations: Yes    Attends Engineer, structural: More than 4 times per year    Marital  Status: Married    Tobacco Counseling Counseling given: Not Answered    Clinical Intake:  Pre-visit preparation completed: Yes  Pain : No/denies pain     Nutritional Risks: None Diabetes: No  Lab Results  Component Value Date   HGBA1C 5.5 12/12/2020   HGBA1C 5.6 12/19/2019     How often do you need to have someone help you when you read instructions, pamphlets, or other written materials from your doctor or pharmacy?: 1 - Never  Interpreter Needed?: No  Information entered by :: NAllen LPN   Activities of Daily Living     11/07/2023    7:26 PM  In your present state of health, do you have any difficulty performing the following activities:  Hearing? 0  Vision? 0  Difficulty concentrating or making decisions? 0  Walking or climbing stairs? 0  Dressing or bathing? 0  Doing errands, shopping? 0  Preparing Food and eating ? N  Using the Toilet? N  In the past six months, have you accidently leaked urine? Y  Do you have problems with loss of bowel control? N  Managing your Medications? N  Managing your Finances? N  Housekeeping or managing your Housekeeping? N    Patient Care Team: Joyce Norleen BROCKS, MD as PCP - General (Family Medicine) Vertell Pont, RN as Oncology Nurse Navigator  I have updated your Care Teams any recent Medical Services you may have received from other providers in the past year.     Assessment:   This is a routine wellness examination for Sartaj.  Hearing/Vision screen Hearing Screening - Comments:: Denies hearing issues Vision Screening - Comments:: Regular eye exams, [facility]   Goals Addressed             This Visit's Progress    Patient Stated       11/08/2023, wants to get over prostate cancer       Depression Screen     11/08/2023    3:04 PM 06/27/2023    9:44 AM 11/02/2022    1:34 PM 01/11/2022   10:30 AM 07/27/2021   10:26 AM 01/02/2021    1:44 PM 09/11/2020    1:30 PM  PHQ 2/9 Scores  PHQ - 2 Score 0 0 0 0 0 0 0  PHQ-  9 Score 0  0        Fall Risk     11/07/2023    7:26 PM 11/02/2022    1:33 PM 01/11/2022   10:29 AM 07/27/2021   10:26 AM 01/02/2021    1:44 PM  Fall Risk   Falls in the past year? 0 0 0 0 0  Number falls in past yr: 0 0 0 0 0  Injury with Fall? 0 0 0 0 0  Risk for fall due to : Medication side effect Medication side effect No Fall Risks No Fall Risks No Fall Risks  Follow up Falls prevention discussed;Falls evaluation completed Falls prevention discussed;Falls evaluation completed Falls evaluation completed  Falls evaluation completed  Falls evaluation completed      Data saved with a previous flowsheet row definition    MEDICARE RISK AT HOME:  Medicare Risk at Home Any stairs in or around the home?: (Patient-Rptd) Yes If so, are there any without handrails?: (Patient-Rptd) No Home free of loose throw rugs in walkways, pet beds, electrical cords, etc?: (Patient-Rptd) Yes Adequate lighting in your home to reduce risk of falls?: (Patient-Rptd) Yes Life alert?: (Patient-Rptd) No Use of a cane, walker or w/c?: (Patient-Rptd) No Grab bars in the bathroom?: (Patient-Rptd) Yes Shower chair or bench in shower?: (Patient-Rptd) No Elevated toilet seat or a handicapped toilet?: (Patient-Rptd) No  TIMED UP AND GO:  Was the test performed?  No  Cognitive Function: 6CIT completed        11/08/2023    3:05 PM 11/02/2022    1:35 PM 01/11/2022   10:32 AM  6CIT Screen  What Year? 0 points 0 points 0 points  What month? 0 points 0 points 0 points  What time? 0 points 0 points 0 points  Count back from 20 0 points 0 points 0 points  Months in reverse 0 points 0 points 0 points  Repeat phrase 0 points 0 points 0  points  Total Score 0 points 0 points 0 points    Immunizations Immunization History  Administered Date(s) Administered   DTaP 09/27/1995   Fluad Quad(high Dose 65+) 12/20/2018, 01/11/2022   Influenza Split 02/03/2012   Influenza, High Dose Seasonal PF 01/31/2017, 12/30/2022    Influenza, Quadrivalent, Recombinant, Inj, Pf 01/22/2018   Influenza-Unspecified 02/16/2016, 01/22/2018, 01/01/2021   Moderna Sars-Covid-2 Vaccination 06/07/2019, 07/02/2019   PFIZER Comirnaty(Gray Top)Covid-19 Tri-Sucrose Vaccine 12/30/2022   PFIZER(Purple Top)SARS-COV-2 Vaccination 01/18/2020, 07/30/2020   PPD Test 06/06/1995   Pfizer Covid-19 Vaccine Bivalent Booster 28yrs & up 01/01/2021   Pneumococcal Conjugate-13 01/22/2018   Pneumococcal Polysaccharide-23 08/11/2006   Pneumococcal-Unspecified 01/22/2018   Tdap 08/11/2006, 07/18/2017   Zoster Recombinant(Shingrix ) 01/31/2017, 07/18/2017   Zoster, Live 07/05/2011    Screening Tests Health Maintenance  Topic Date Due   COVID-19 Vaccine (7 - 2024-25 season) 02/24/2023   INFLUENZA VACCINE  11/25/2023   Medicare Annual Wellness (AWV)  11/07/2024   Colonoscopy  11/08/2025   DTaP/Tdap/Td (4 - Td or Tdap) 07/19/2027   Hepatitis C Screening  Completed   Zoster Vaccines- Shingrix   Completed   Hepatitis B Vaccines  Aged Out   HPV VACCINES  Aged Out   Meningococcal B Vaccine  Aged Out   Pneumococcal Vaccine: 50+ Years  Discontinued    Health Maintenance  Health Maintenance Due  Topic Date Due   COVID-19 Vaccine (7 - 2024-25 season) 02/24/2023   Health Maintenance Items Addressed: Up to date  Additional Screening:  Vision Screening: Recommended annual ophthalmology exams for early detection of glaucoma and other disorders of the eye. Would you like a referral to an eye doctor? No    Dental Screening: Recommended annual dental exams for proper oral hygiene  Community Resource Referral / Chronic Care Management: CRR required this visit?  No   CCM required this visit?  No   Plan:    I have personally reviewed and noted the following in the patient's chart:   Medical and social history Use of alcohol, tobacco or illicit drugs  Current medications and supplements including opioid prescriptions. Patient is not currently  taking opioid prescriptions. Functional ability and status Nutritional status Physical activity Advanced directives List of other physicians Hospitalizations, surgeries, and ER visits in previous 12 months Vitals Screenings to include cognitive, depression, and falls Referrals and appointments  In addition, I have reviewed and discussed with patient certain preventive protocols, quality metrics, and best practice recommendations. A written personalized care plan for preventive services as well as general preventive health recommendations were provided to patient.   Ardella FORBES Dawn, LPN   2/84/7974   After Visit Summary: (MyChart) Due to this being a telephonic visit, the after visit summary with patients personalized plan was offered to patient via MyChart   Notes: Nothing significant to report at this time.

## 2023-11-08 NOTE — Telephone Encounter (Signed)
 This nurse received a message that patient called to state that he did not receive a call for his AWV. I attempted to call three times. I double checked and it looked like I crossed two numbers when calling. I attempted to call patient back at correct number and it rings and rings. A message then comes up that the call can not be completed at this time. I was unable to leave a message.

## 2023-11-09 ENCOUNTER — Telehealth: Admitting: Family Medicine

## 2023-11-09 ENCOUNTER — Encounter: Payer: Self-pay | Admitting: Family Medicine

## 2023-11-09 ENCOUNTER — Ambulatory Visit: Payer: Self-pay

## 2023-11-09 VITALS — BP 163/96 | HR 63 | Temp 97.9°F | Ht 71.5 in | Wt 205.0 lb

## 2023-11-09 DIAGNOSIS — U071 COVID-19: Secondary | ICD-10-CM | POA: Diagnosis not present

## 2023-11-09 DIAGNOSIS — R030 Elevated blood-pressure reading, without diagnosis of hypertension: Secondary | ICD-10-CM

## 2023-11-09 NOTE — Patient Instructions (Signed)
 Stay well hydrated. Use plain Mucinex 12 hour, taken twice daily. This is an expectorant (guaifenesin) that will keep the mucus and phlegm thin. This might help with cough in and of itself, by making the mucus thinner. If you have persistent cough, you can take a separate Delsym syrup (12 hour dextromethorphan) Avoid use of decongestants (phenylephrine, pseudoephedrine).  Use sinus rinses (neti-pot or sinus rinse kit)--be sure to use boiled or distilled water, not tap water. You can use this up to twice daily, if needed.  If you develop high fever or worsening symptoms in the next couple of days, it isn't too late for Paxlovid--this can be used as long as it is started within 5 days of symptoms.  Contact us  by MyChart, and let us  know your preferred pharmacy.  Stay isolated until you are fever-free for at least 24 hours without the use of a fever-reducing medication (tylenol , ibuprofen  etc), and significant improvement in your respiratory symptoms.  Once feeling better, periodically check your blood pressure to ensure that it is back down to normal (<130/80, up to 140/90 is borderline). If they are consistently >135-140/85-90, come back and see your PCP to discuss).

## 2023-11-09 NOTE — Progress Notes (Signed)
 Start time: 1:27 End time: 1:51  Virtual Visit via Video Note  I connected with Adam Rosales on 11/09/23 by a video enabled telemedicine application and verified that I am speaking with the correct person using two identifiers.  Location: Patient: home Provider: office   I discussed the limitations of evaluation and management by telemedicine and the availability of in person appointments. The patient expressed understanding and agreed to proceed.  History of Present Illness:  Chief Complaint  Patient presents with   Covid Positive    VIRTUAL covid positive today. Symptoms started Monday. Lots of congestion, not much of a cough, no fever. Feels tired.    Symptoms started 7/14--he started with some mild cold symptoms that afternoon. Felt about the same yesterday. He tested + for COVID today.  Just feels like a mild cold. No sore throat or ear pain.  Just a slight cough, related to PND. Nasal drainage is clear-white. No fever, chills, body aches.  No sick contacts or travel.  Took lagevrio  with COVID in 2023.   He has a h/o HTN. BP usually 115/74, but admits he hasn't checked it in a month. He took Dayquil earlier today.    PMH, PSH, SH reviewed  Aortic atherosclerosis, HLD, prostate CA, kidney stone  Outpatient Encounter Medications as of 11/09/2023  Medication Sig Note   atorvastatin  (LIPITOR) 20 MG tablet Take 1 tablet (20 mg total) by mouth daily.    ibuprofen  (ADVIL ) 200 MG tablet Take 200 mg by mouth every 8 (eight) hours as needed (for pain.). 11/09/2023: As needed   Multiple Vitamin (MULTIVITAMIN) capsule Take 1 capsule by mouth daily.    Phenylephrine-DM-GG-APAP (MUCINEX FAST-MAX) 5-10-200-325 MG CAPS Take 2 capsules by mouth. 11/09/2023: 2 capsules at noon (acetaminaphen 325, dextromethorphan 10mg , guafen 200mg  phenyleph)   [DISCONTINUED] Pseudoephedrine-APAP-DM (DAYQUIL PO) Take 2 capsules by mouth. 11/09/2023: Took 2 at noon   [DISCONTINUED] FLUZONE HIGH-DOSE  0.5 ML injection     [DISCONTINUED] SPIKEVAX syringe     No facility-administered encounter medications on file as of 11/09/2023.   Allergies  Allergen Reactions   Tamsulosin  Other (See Comments)    Made blood pressure drop.   ROS: no f/c, HA, dizziness, CP, SOB.  +URI symptoms per HPI. No n/v/d. No rash. No other concerns.    Observations/Objective:  BP (!) 163/96   Pulse 63   Ht 5' 11.5 (1.816 m)   Wt 205 lb (93 kg)   BMI 28.19 kg/m  Repeat BP after sitting for a bit was 162/99 (rechecked during visit).  Well-appearing, pleasant male.  He is alert and oriented. No sniffling, throat-clearing or coughing during visit. He is speaking easily and comfortably. Exam is limited due to the virtual nature of the visit   Assessment and Plan:   COVID-19 - supportive measures reviewed. Declined Paxlovid given mild sx--if worse and <5d sx, he can call for Rx  Elevated blood pressure reading - suspect related to decongestant/illness.  He has h/o HTN, so encouraged low Na diet and regular monitoring. f/u w/PCP if remains high  Stay well hydrated. Use plain Mucinex 12 hour, taken twice daily. This is an expectorant (guaifenesin) that will keep the mucus and phlegm thin. This might help with cough in and of itself, by making the mucus thinner. If you have persistent cough, you can take a separate Delsym syrup (12 hour dextromethorphan) Avoid use of decongestants (phenylephrine, pseudoephedrine).  Use sinus rinses (neti-pot or sinus rinse kit)--be sure to use boiled or distilled water,  not tap water. You can use this up to twice daily, if needed.  If you develop high fever or worsening symptoms in the next couple of days, it isn't too late for Paxlovid--this can be used as long as it is started within 5 days of symptoms.  Contact us  by MyChart, and let us  know your preferred pharmacy.  Stay isolated until you are fever-free for at least 24 hours without the use of a fever-reducing  medication (tylenol , ibuprofen  etc), and significant improvement in your respiratory symptoms.  Once feeling better, periodically check your blood pressure to ensure that it is back down to normal (<130/80, up to 140/90 is borderline). If they are consistently >135-140/85-90, come back and see your PCP to discuss).   Follow Up Instructions:    I discussed the assessment and treatment plan with the patient. The patient was provided an opportunity to ask questions and all were answered. The patient agreed with the plan and demonstrated an understanding of the instructions.   The patient was advised to call back or seek an in-person evaluation if the symptoms worsen or if the condition fails to improve as anticipated.  I spent 29 minutes dedicated to the care of this patient, including pre-visit review of records, face to face time, post-visit ordering of testing and documentation.    Annabelle DELENA Fetters, MD

## 2023-11-09 NOTE — Telephone Encounter (Signed)
 Virtual scheduled for today with Dr Randol

## 2023-11-09 NOTE — Telephone Encounter (Signed)
 FYI Only or Action Required?: FYI only for provider.  Patient was last seen in primary care on 02/09/2023 by Joyce Norleen BROCKS, MD.  Called Nurse Triage reporting Covid Positive.  Symptoms began yesterday.  Interventions attempted: Rest, hydration, or home remedies.  Symptoms are: unchanged.  Triage Disposition: Home Care  Patient/caregiver understands and will follow disposition?: Yes   Copied from CRM [id number]. Topic: Clinical - Pink Word Triage >> Nov 09, 2023 10:10 AM Gustabo D wrote: Patient took a home covid test and it came back positive. He said he has like a bad cold. He wants to know what his next steps should be . Reason for Disposition  [1] COVID-19 diagnosed by positive lab test (e.g., PCR, rapid self-test kit) AND [2] NO symptoms (e.g., cough, fever, others)  Answer Assessment - Initial Assessment Questions 1. COVID-19 DIAGNOSIS: How do you know that you have COVID? (e.g., positive lab test or self-test, diagnosed by doctor or NP/PA, symptoms after exposure).     Home test  3. ONSET: When did the COVID-19 symptoms start?      Two days ago 4. WORST SYMPTOM: What is your worst symptom? (e.g., cough, fever, shortness of breath, muscle aches)     Runny nose, stuffed up 5. COUGH: Do you have a cough? If Yes, ask: How bad is the cough?       mild 6. FEVER: Do you have a fever? If Yes, ask: What is your temperature, how was it measured, and when did it start?     denies 7. RESPIRATORY STATUS: Describe your breathing? (e.g., normal; shortness of breath, wheezing, unable to speak)      denies 8. BETTER-SAME-WORSE: Are you getting better, staying the same or getting worse compared to yesterday?  If getting worse, ask, In what way?     A little worse, just feeling crummy    10. HIGH RISK DISEASE: Do you have any chronic medical problems? (e.g., asthma, heart or lung disease, weak immune system, obesity, etc.)       denies 11. VACCINE: Have you had the  COVID-19 vaccine? If Yes, ask: Which one, how many shots, when did you get it?       Last dose 4 weeks ago  Protocols used: Coronavirus (COVID-19) Diagnosed or Suspected-A-AH

## 2023-12-09 ENCOUNTER — Telehealth: Payer: Self-pay

## 2023-12-09 DIAGNOSIS — E785 Hyperlipidemia, unspecified: Secondary | ICD-10-CM

## 2023-12-09 DIAGNOSIS — R931 Abnormal findings on diagnostic imaging of heart and coronary circulation: Secondary | ICD-10-CM

## 2023-12-09 NOTE — Telephone Encounter (Signed)
 Copied from CRM #[id number]. Topic: Clinical - Request for Lab/Test Order >> Dec 09, 2023 12:10 PM Graeme ORN wrote: Reason for CRM: Patient called. States friend who is physician recommenced he has Calcium  CT Scan. Has previous spoken with Dr Joyce about need to watch heart at his age. Knows its not covered by insurance but order. Referral still requested. Preferred location - [facility] either [facility]. Thank You

## 2023-12-10 NOTE — Addendum Note (Signed)
 Addended by: JOYCE NORLEEN BROCKS on: 12/10/2023 09:26 AM   Modules accepted: Orders

## 2023-12-12 NOTE — Telephone Encounter (Signed)
 Called Pt and they had called him this morning to schedule.

## 2023-12-15 DIAGNOSIS — C61 Malignant neoplasm of prostate: Secondary | ICD-10-CM | POA: Diagnosis not present

## 2023-12-22 DIAGNOSIS — R3912 Poor urinary stream: Secondary | ICD-10-CM | POA: Diagnosis not present

## 2023-12-22 DIAGNOSIS — N401 Enlarged prostate with lower urinary tract symptoms: Secondary | ICD-10-CM | POA: Diagnosis not present

## 2023-12-22 DIAGNOSIS — R3915 Urgency of urination: Secondary | ICD-10-CM | POA: Diagnosis not present

## 2023-12-22 DIAGNOSIS — C61 Malignant neoplasm of prostate: Secondary | ICD-10-CM | POA: Diagnosis not present

## 2023-12-28 ENCOUNTER — Ambulatory Visit (HOSPITAL_BASED_OUTPATIENT_CLINIC_OR_DEPARTMENT_OTHER)
Admission: RE | Admit: 2023-12-28 | Discharge: 2023-12-28 | Disposition: A | Discharge status: Home / Self Care | Payer: Self-pay | Source: Ambulatory Visit | Attending: Family Medicine | Admitting: Family Medicine

## 2023-12-28 DIAGNOSIS — E785 Hyperlipidemia, unspecified: Secondary | ICD-10-CM | POA: Insufficient documentation

## 2023-12-29 NOTE — Addendum Note (Signed)
 Addended by: JOYCE NORLEEN BROCKS on: 12/29/2023 10:23 AM   Modules accepted: Orders

## 2024-01-04 ENCOUNTER — Other Ambulatory Visit: Payer: Self-pay | Admitting: Internal Medicine

## 2024-01-04 ENCOUNTER — Ambulatory Visit: Payer: Self-pay | Admitting: Family Medicine

## 2024-01-04 DIAGNOSIS — R9389 Abnormal findings on diagnostic imaging of other specified body structures: Secondary | ICD-10-CM

## 2024-02-09 ENCOUNTER — Ambulatory Visit: Attending: Cardiology | Admitting: Cardiology

## 2024-02-09 ENCOUNTER — Ambulatory Visit: Admitting: Cardiology

## 2024-02-09 ENCOUNTER — Other Ambulatory Visit: Payer: Self-pay | Admitting: Cardiology

## 2024-02-09 ENCOUNTER — Encounter: Payer: Self-pay | Admitting: Cardiology

## 2024-02-09 ENCOUNTER — Other Ambulatory Visit (HOSPITAL_COMMUNITY): Payer: Self-pay

## 2024-02-09 ENCOUNTER — Encounter (HOSPITAL_BASED_OUTPATIENT_CLINIC_OR_DEPARTMENT_OTHER): Payer: Self-pay | Admitting: *Deleted

## 2024-02-09 VITALS — BP 144/82 | HR 59 | Ht 70.0 in | Wt 206.8 lb

## 2024-02-09 DIAGNOSIS — E782 Mixed hyperlipidemia: Secondary | ICD-10-CM | POA: Diagnosis not present

## 2024-02-09 DIAGNOSIS — I2510 Atherosclerotic heart disease of native coronary artery without angina pectoris: Secondary | ICD-10-CM

## 2024-02-09 DIAGNOSIS — Z01810 Encounter for preprocedural cardiovascular examination: Secondary | ICD-10-CM | POA: Insufficient documentation

## 2024-02-09 DIAGNOSIS — Z131 Encounter for screening for diabetes mellitus: Secondary | ICD-10-CM | POA: Diagnosis not present

## 2024-02-09 DIAGNOSIS — I25118 Atherosclerotic heart disease of native coronary artery with other forms of angina pectoris: Secondary | ICD-10-CM

## 2024-02-09 MED ORDER — NITROGLYCERIN 0.4 MG SL SUBL
0.4000 mg | SUBLINGUAL_TABLET | SUBLINGUAL | 3 refills | Status: DC | PRN
Start: 2024-02-09 — End: 2024-02-09

## 2024-02-09 MED ORDER — NITROGLYCERIN 0.4 MG SL SUBL
0.4000 mg | SUBLINGUAL_TABLET | SUBLINGUAL | 3 refills | Status: AC | PRN
  Filled 2024-02-09: qty 25, 15d supply, fill #0

## 2024-02-09 MED ORDER — ASPIRIN 81 MG PO TBEC: 81.0000 mg | DELAYED_RELEASE_TABLET | Freq: Every day | ORAL | 2 refills | Status: DC

## 2024-02-09 MED ORDER — ASPIRIN 81 MG PO TBEC
81.0000 mg | DELAYED_RELEASE_TABLET | Freq: Every day | ORAL | 2 refills | Status: DC
  Filled 2024-02-09: qty 90, 90d supply, fill #0

## 2024-02-09 NOTE — Progress Notes (Signed)
 Cardiology Office Note:  .   Date:  02/09/2024  ID:  Adam Rosales Dayhoff, DOB Feb 26, 1951, MRN [id number] PCP: Joyce Norleen JAYSON, MD  [facility] Providers Cardiologist:  Newman Lawrence, MD PCP: Joyce Norleen JAYSON, MD  Chief Complaint  Patient presents with   Coronary calcification     Adam Rosales is a 73 y.o. male with coronary calcification  Discussed the use of AI scribe software for clinical note transcription with the patient, who gave verbal consent to proceed.  History of Present Illness Adam Rosales is a 73 year old male with coronary artery calcification  Patient retired he was a professor, stays very active with up to 8 miles of walking.  Reports episodes of chest pain when he pushes himself more on treadmill, lasting for about 15 to 20-minute, resolves with rest. Occasional shortness of breath is noted during treadmill running.  Family history is significant for heart disease, with his father having died of heart disease at age 51. He maintains an active lifestyle, walking eight miles daily and performing yard work. He takes atorvastatin  20 mg daily for high cholesterol and has no smoking history.  A recent CT scan revealed coronary artery calcification, with concern about concentration in one artery. Blood pressure is typically 130/70 mmHg at home, though higher in medical settings due to 'white coat syndrome'. No leg swelling is observed.      Vitals:   02/09/24 1452  BP: (!) 144/82  Pulse: (!) 59  SpO2: 99%      Review of Systems  Cardiovascular:  Positive for chest pain and dyspnea on exertion. Negative for leg swelling, palpitations and syncope.        Studies Reviewed: SABRA        EKG 02/09/2024: Sinus rhythm 59 bpm Non-specific intra-ventricular conduction delay Nonspecific T wave abnormality When compared with ECG of When compared with ECG of 12-Dec-2020 09:31,QRS axis Shifted right   CT cardiac scoring 12/2023: Coronary arteries:  Normal origins. Coronary Calcium  Score: Left main: 12.5 Left anterior descending artery: 579 Left circumflex artery: 46.1 Right coronary artery: 9.46   Total: 647 Percentile: 75th   Pericardium: Normal. Ascending Aorta: Mildly dilated ascending aorta measuring 39mm at the level of the bifurcation of the main pulmonary artery.   Labs 01/2023: Chol 124, TG 91, HDL 51, LDL 56 HbA1C 5.5% Hb 17.3 Cr 0.9    Physical Exam Vitals and nursing note reviewed.  Constitutional:      General: He is not in acute distress. Neck:     Vascular: No JVD.  Cardiovascular:     Rate and Rhythm: Normal rate and regular rhythm.     Heart sounds: Normal heart sounds. No murmur heard. Pulmonary:     Effort: Pulmonary effort is normal.     Breath sounds: Normal breath sounds. No wheezing or rales.  Musculoskeletal:     Right lower leg: No edema.     Left lower leg: No edema.      VISIT DIAGNOSES:   ICD-10-CM   1. Coronary artery calcification  I25.10 EKG 12-Lead    CBC    Basic metabolic panel with GFR    MYOCARDIAL PERFUSION IMAGING    Cardiac Stress Test: Informed Consent Details: Physician/Practitioner Attestation; Transcribe to consent form and obtain patient signature    2. Mixed hyperlipidemia  E78.2 Lipid panel    3. Screening for diabetes mellitus  Z13.1 Hemoglobin A1c    4. Coronary artery disease of native artery of native  heart with stable angina pectoris  I25.118        Adam Rosales is a 73 y.o. male with coronary calcification Assessment and Plan Assessment & Plan Atherosclerotic heart disease of native coronary artery with stable angina pectoris: Intermittent chest pain post-exertion with high coronary calcium  score suggests plaque buildup and calcification. Stable angina suspected, cardiac origin not excluded. - Order exercise nuclear stress test to evaluate myocardial blood flow. - Prescribe aspirin 81 mg daily. - Provide nitroglycerin for use as needed for  intense chest pain. - Advise to avoid exceeding exercise limits that induce symptoms.  Suspected polycythemia: Elevated hemoglobin level at 17 in a non-smoker not living at high altitude suggests possible polycythemia or high activity level. - Order repeat hemoglobin test. - Order routine kidney function tests.  Hyperlipidemia: Cholesterol levels controlled on atorvastatin , but high calcium  score suggests other factors like lipoprotein(a). - Continue atorvastatin  20 mg daily. - Order lipid panel and lipoprotein(a) test.  Elevated blood pressure without diagnosis of hypertension: Blood pressure generally normal.  Suspect whitecoat hypertension.  Continue to monitor for now.  Screening for diabetes mellitus: Will check A1c.   Informed Consent   Shared Decision Making/Informed Consent The risks [chest pain, shortness of breath, cardiac arrhythmias, dizziness, blood pressure fluctuations, myocardial infarction, stroke/transient ischemic attack, nausea, vomiting, allergic reaction, radiation exposure, metallic taste sensation and life-threatening complications (estimated to be 1 in 10,000)], benefits (risk stratification, diagnosing coronary artery disease, treatment guidance) and alternatives of a nuclear stress test were discussed in detail with Mr. Chalfin and he agrees to proceed.       Meds ordered this encounter  Medications   DISCONTD: aspirin EC 81 MG tablet    Sig: Take 1 tablet (81 mg total) by mouth daily. Swallow whole.    Dispense:  90 tablet    Refill:  2   DISCONTD: nitroGLYCERIN (NITROSTAT) 0.4 MG SL tablet    Sig: Place 1 tablet (0.4 mg total) under the tongue every 5 (five) minutes as needed for chest pain.    Dispense:  25 tablet    Refill:  3   aspirin EC 81 MG tablet    Sig: Take 1 tablet (81 mg total) by mouth daily. Swallow whole.    Dispense:  90 tablet    Refill:  2   nitroGLYCERIN (NITROSTAT) 0.4 MG SL tablet    Sig: Place 1 tablet (0.4 mg total) under the  tongue every 5 (five) minutes as needed for chest pain.    Dispense:  25 tablet    Refill:  3     F/u in 3 months  Signed, Newman JINNY Lawrence, MD

## 2024-02-09 NOTE — Patient Instructions (Signed)
 Medication Instructions:  Your physician has recommended you make the following change in your medication:  1.) start aspirin 81 mg - take one tablet daily 2.) as needed - take nitroglycerin 0.4 mg - under  your tongue as directed:  If a single episode of chest pain is not relieved by one tablet,  try another within 5 minutes; and if this doesn't relieve the pain, try another within 5 minutes and call 911 for transportation to an emergency department.   *If you need a refill on your cardiac medications before your next appointment, please call your pharmacy*  Lab Work: Today: cbc, bmet, lipid panel, HgA1c   Testing/Procedures: Your physician has requested that you have en exercise stress myoview. For further information please visit [email or web address] Please follow instruction sheet, as given.   Follow-Up: At [facility], you and your health needs are our priority.  As part of our continuing mission to provide you with exceptional heart care, our providers are all part of one team.  This team includes your primary Cardiologist (physician) and Advanced Practice Providers or APPs (Physician Assistants and Nurse Practitioners) who all work together to provide you with the care you need, when you need it.  Your next appointment:   8-12 week(s)  Provider:   Newman Lawrence  Other Instructions

## 2024-02-10 ENCOUNTER — Ambulatory Visit: Payer: Self-pay | Admitting: Cardiology

## 2024-02-10 ENCOUNTER — Telehealth (HOSPITAL_COMMUNITY): Payer: Self-pay | Admitting: *Deleted

## 2024-02-10 LAB — BASIC METABOLIC PANEL WITH GFR
BUN/Creatinine Ratio: 16 (ref 10–24)
BUN: 12 mg/dL (ref 8–27)
CO2: 24 mmol/L (ref 20–29)
Calcium: 9.4 mg/dL (ref 8.6–10.2)
Chloride: 103 mmol/L (ref 96–106)
Creatinine, Ser: 0.75 mg/dL — ABNORMAL LOW (ref 0.76–1.27)
Glucose: 84 mg/dL (ref 70–99)
Potassium: 4.1 mmol/L (ref 3.5–5.2)
Sodium: 141 mmol/L (ref 134–144)
eGFR: 96 mL/min/1.73 (ref >59)

## 2024-02-10 LAB — CBC
Hematocrit: 49 % (ref 37.5–51.0)
Hemoglobin: 16.2 g/dL (ref 13.0–17.7)
MCH: 31.3 pg (ref 26.6–33.0)
MCHC: 33.1 g/dL (ref 31.5–35.7)
MCV: 95 fL (ref 79–97)
Platelets: 258 x10E3/uL (ref 150–450)
RBC: 5.17 x10E6/uL (ref 4.14–5.80)
RDW: 12.3 % (ref 11.6–15.4)
WBC: 6.9 x10E3/uL (ref 3.4–10.8)

## 2024-02-10 LAB — LIPID PANEL
Chol/HDL Ratio: 3 ratio (ref 0.0–5.0)
Cholesterol, Total: 147 mg/dL (ref 100–199)
HDL: 49 mg/dL (ref >39)
LDL Chol Calc (NIH): 71 mg/dL (ref 0–99)
Triglycerides: 161 mg/dL — ABNORMAL HIGH (ref 0–149)
VLDL Cholesterol Cal: 27 mg/dL (ref 5–40)

## 2024-02-10 LAB — HEMOGLOBIN A1C
Est. average glucose Bld gHb Est-mCnc: 108 mg/dL
Hgb A1c MFr Bld: 5.4 % (ref 4.8–5.6)

## 2024-02-10 NOTE — Telephone Encounter (Signed)
 Patient given detailed instructions per Myocardial Perfusion Study Information Sheet for the test on 02/13/2024 at 10:45. Patient notified to arrive 15 minutes early and that it is imperative to arrive on time for appointment to keep from having the test rescheduled.  If you need to cancel or reschedule your appointment, please call the office within 24 hours of your appointment. . Patient verbalized understanding.Adam Rosales

## 2024-02-13 ENCOUNTER — Ambulatory Visit (HOSPITAL_COMMUNITY)
Admission: RE | Admit: 2024-02-13 | Discharge: 2024-02-13 | Disposition: A | Discharge status: Home / Self Care | Source: Ambulatory Visit | Attending: Cardiology | Admitting: Cardiology

## 2024-02-13 DIAGNOSIS — I700 Atherosclerosis of aorta: Secondary | ICD-10-CM | POA: Diagnosis not present

## 2024-02-13 DIAGNOSIS — I2510 Atherosclerotic heart disease of native coronary artery without angina pectoris: Secondary | ICD-10-CM | POA: Diagnosis not present

## 2024-02-13 LAB — MYOCARDIAL PERFUSION IMAGING
Angina Index: 0
Duke Treadmill Score: 10
Estimated workload: 12.1
Exercise duration (min): 10 min
Exercise duration (sec): 15 s
LV dias vol: 134 mL (ref 62–150)
LV sys vol: 56 mL (ref 4.2–5.8)
MPHR: 148 {beats}/min
Nuc Stress EF: 58 %
Peak HR: 129 {beats}/min
Percent HR: 87 %
Rest HR: 64 {beats}/min
Rest Nuclear Isotope Dose: 10.5 mCi
SDS: 7
SRS: 0
SSS: 7
ST Depression (mm): 0 mm
Stress Nuclear Isotope Dose: 32.9 mCi
TID: 0.91

## 2024-02-13 MED ORDER — TECHNETIUM TC 99M TETROFOSMIN IV KIT
10.5000 | PACK | Freq: Once | INTRAVENOUS | Status: AC | PRN
  Administered 2024-02-13: 10.5 via INTRAVENOUS

## 2024-02-13 MED ORDER — TECHNETIUM TC 99M TETROFOSMIN IV KIT
32.9000 | PACK | Freq: Once | INTRAVENOUS | Status: AC | PRN
  Administered 2024-02-13: 32.9 via INTRAVENOUS

## 2024-02-14 ENCOUNTER — Ambulatory Visit: Payer: Medicare PPO | Admitting: Family Medicine

## 2024-02-14 ENCOUNTER — Encounter: Payer: Self-pay | Admitting: Family Medicine

## 2024-02-14 VITALS — BP 142/88 | HR 71 | Ht 70.0 in | Wt 204.8 lb

## 2024-02-14 DIAGNOSIS — I10 Essential (primary) hypertension: Secondary | ICD-10-CM

## 2024-02-14 DIAGNOSIS — I700 Atherosclerosis of aorta: Secondary | ICD-10-CM | POA: Diagnosis not present

## 2024-02-14 DIAGNOSIS — E785 Hyperlipidemia, unspecified: Secondary | ICD-10-CM | POA: Diagnosis not present

## 2024-02-14 DIAGNOSIS — Z0000 Encounter for general adult medical examination without abnormal findings: Secondary | ICD-10-CM | POA: Diagnosis not present

## 2024-02-14 MED ORDER — ATORVASTATIN CALCIUM 40 MG PO TABS: 40.0000 mg | ORAL_TABLET | Freq: Every day | ORAL | 0 refills | Status: DC

## 2024-02-14 NOTE — Progress Notes (Signed)
Normal result letter mailed.

## 2024-02-14 NOTE — Progress Notes (Signed)
 Name: Adam Rosales   Date of Visit: 02/14/24   Date of last visit with me: Visit date not found   CHIEF COMPLAINT:  Chief Complaint  Patient presents with   Annual Exam    Cpe. Had a stress test done yesterday. Has a biopsy coming up on Thursday for prostate cancer.        HPI:  Discussed the use of AI scribe software for clinical note transcription with the patient, who gave verbal consent to proceed.  History of Present Illness   Adam Rosales is a 73 year old male with coronary artery disease who presents for follow-up after a recent stress test.  He underwent a stress test yesterday, which was normal, but he is concerned about coronary calcifications in the left artery mentioned in the over-read of the test results. He describes experiencing a 'little hurting' in the aorta. He is currently on a low dose of atorvastatin .  He has a history of prostate cancer and is scheduled for a biopsy in two days.  He is concerned about his blood pressure, noting that it is often high when measured at the doctor's office and at home when he is worried. However, recent home measurements were 125/74 mmHg and 118/67 mmHg with a heart rate of 63-65 bpm.  He has a history of significant weight loss, having lost 60 pounds eight years ago. He exercises regularly, walking eight miles a day, and is considering starting weight training. 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 He feels great overall.         OBJECTIVE:       02/14/2024    3:11 PM  Depression screen PHQ 2/9  Decreased Interest 0  Down, Depressed, Hopeless 0  PHQ - 2 Score 0     BP Readings from Last 3 Encounters:  02/14/24 (!) 142/88  02/09/24 (!) 144/82  11/09/23 (!) 163/96    BP (!) 142/88   Pulse 71   Ht 5' 10 (1.778 m)   Wt 204 lb 12.8 oz (92.9 kg)   SpO2 99%   BMI 29.39 kg/m    Physical Exam          Physical Exam Constitutional:      Appearance:  Normal appearance.  Neurological:     General: No focal deficit present.     Mental Status: He is alert and oriented to person, place, and time. Mental status is at baseline.     ASSESSMENT/PLAN:   Assessment & Plan Annual physical exam  Hyperlipidemia with target LDL less than 100  Aortic atherosclerosis  Primary hypertension    Assessment and Plan    Coronary artery calcification Age-related calcification in the left coronary artery. Normal stress test and ejection fraction of 58% indicate good heart function. Monitoring is crucial to prevent myocardial infarction. - Increase Glipitor dose from 20 mg to 40 mg. - Start baby aspirin 2-3 days post-prostate biopsy.  Hyperlipidemia Increasing Glipitor dose expected to reduce cholesterol by an additional 10%, aiding in managing coronary calcification. - Increase Glipitor dose from 20 mg to 40 mg.  Prostate cancer under active surveillance Prostate cancer under active surveillance with upcoming biopsy. Advised to delay aspirin to minimize bleeding risk. - Delay starting baby aspirin until 2-3 days post-biopsy.  Hypertension Home blood pressure normal, clinic readings elevated possibly due to stress. Goal is <140/80 mmHg. Regular monitoring needed. - Monitor blood pressure morning and evening 3-4 times a week for  six weeks. - Bring blood pressure machine to next appointment for comparison. - Follow up in six weeks to review data and assess medication need.  General Health Maintenance Weight training recommended to maintain muscle mass and joint health, prevent overuse injuries, and maintain independence. - Incorporate weight training into routine, at least 120 minutes per week. - Use machine weights to minimize injury risk. - Consider online resources for guidance.  COVID-19 Vaccination Emphasized importance of vaccination to prevent severe COVID-19 outcomes despite potential side effects.         Naythen Heikkila A. Vita  MD [facility]

## 2024-02-16 DIAGNOSIS — R399 Unspecified symptoms and signs involving the genitourinary system: Secondary | ICD-10-CM | POA: Diagnosis not present

## 2024-02-16 DIAGNOSIS — C61 Malignant neoplasm of prostate: Secondary | ICD-10-CM | POA: Diagnosis not present

## 2024-03-01 DIAGNOSIS — C61 Malignant neoplasm of prostate: Secondary | ICD-10-CM | POA: Diagnosis not present

## 2024-03-01 DIAGNOSIS — R3915 Urgency of urination: Secondary | ICD-10-CM | POA: Diagnosis not present

## 2024-03-01 DIAGNOSIS — N401 Enlarged prostate with lower urinary tract symptoms: Secondary | ICD-10-CM | POA: Diagnosis not present

## 2024-03-12 NOTE — Progress Notes (Signed)
 Nursing interview for a diagnosis of recurrence of prostate cancer.   Patient identity verified x2.   Patient reports frequency. Patient denies all other related issues at this time.  Meaningful use complete.  I-PSS score- 16 - Moderate SHIM score- 12 Urinary Management medication(s) None Urology appointment date-  Saw Dr. Lonni Han a few weeks ago for a biopsy, no upcoming appointments that he knows of   SAFETY ISSUES: Prior radiation? No Pacemaker/ICD? No Possible current pregnancy? Male Is the patient on methotrexate? No   Vitals- BP 137/72 (BP Location: Left Arm, Patient Position: Sitting, Cuff Size: Large)   Pulse 69   Temp (!) 97.5 F (36.4 C)   Resp 18   Ht 5' 10 (1.778 m)   Wt 207 lb 9.6 oz (94.2 kg)   SpO2 98%   BMI 29.79 kg/m

## 2024-03-15 ENCOUNTER — Ambulatory Visit
Admission: RE | Admit: 2024-03-15 | Discharge: 2024-03-15 | Disposition: A | Discharge status: Home / Self Care | Source: Ambulatory Visit | Attending: Urology | Admitting: Urology

## 2024-03-15 ENCOUNTER — Ambulatory Visit
Admission: RE | Admit: 2024-03-15 | Discharge: 2024-03-15 | Disposition: A | Discharge status: Home / Self Care | Source: Ambulatory Visit | Attending: Radiation Oncology | Admitting: Radiation Oncology

## 2024-03-15 VITALS — BP 137/72 | HR 69 | Temp 97.5°F | Resp 18 | Ht 70.0 in | Wt 207.6 lb

## 2024-03-15 DIAGNOSIS — Z191 Hormone sensitive malignancy status: Secondary | ICD-10-CM | POA: Diagnosis not present

## 2024-03-15 DIAGNOSIS — C61 Malignant neoplasm of prostate: Secondary | ICD-10-CM | POA: Diagnosis not present

## 2024-03-15 DIAGNOSIS — Z800 Family history of malignant neoplasm of digestive organs: Secondary | ICD-10-CM | POA: Diagnosis not present

## 2024-03-15 DIAGNOSIS — Z79899 Other long term (current) drug therapy: Secondary | ICD-10-CM | POA: Diagnosis not present

## 2024-03-15 DIAGNOSIS — I10 Essential (primary) hypertension: Secondary | ICD-10-CM | POA: Diagnosis not present

## 2024-03-15 DIAGNOSIS — Z801 Family history of malignant neoplasm of trachea, bronchus and lung: Secondary | ICD-10-CM | POA: Diagnosis not present

## 2024-03-15 DIAGNOSIS — Z7982 Long term (current) use of aspirin: Secondary | ICD-10-CM | POA: Diagnosis not present

## 2024-03-15 DIAGNOSIS — Z8042 Family history of malignant neoplasm of prostate: Secondary | ICD-10-CM | POA: Diagnosis not present

## 2024-03-15 DIAGNOSIS — E785 Hyperlipidemia, unspecified: Secondary | ICD-10-CM | POA: Diagnosis not present

## 2024-03-15 DIAGNOSIS — Z87442 Personal history of urinary calculi: Secondary | ICD-10-CM | POA: Diagnosis not present

## 2024-03-15 NOTE — Progress Notes (Signed)
 Radiation Oncology         [phone] ________________________________  Follow up Outpatient Consultation  Name: Adam Rosales MRN: [id number]  Date: 03/15/2024  DOB: 23-Aug-1950  CC:Jha, Reyne, MD  Devere Lonni Fire*   REFERRING PHYSICIAN: Devere Lonni Fire*  DIAGNOSIS: 73 y.o. gentleman with Stage T1c adenocarcinoma of the prostate with Gleason score of 3+4, and PSA of 5.6.    ICD-10-CM   1. Malignant neoplasm of prostate (HCC)  C61       HISTORY OF PRESENT ILLNESS: Adam Rosales is a 73 y.o. male with a diagnosis of prostate cancer. He has a history of scrotal abscess managed by Dr. Devere in 2022. More recently, he was noted to have an elevated PSA of 5.7 by his primary care physician, Dr. Joyce.  Accordingly, he was referred for evaluation in urology by Sherlyn Moats, NP on 02/21/23,  digital rectal examination performed at that time showed no nodules or induration. A repeat PSA obtained that day showed a slight decrease but persistent elevation at 4.93. He underwent prostate MRI on 04/18/23 showing a PI-RADS 4 lesion in the right anterior peripheral zone at the apex. The patient proceeded to MRI fusion biopsy of the prostate on 06/06/23 which confirmed Gleason 3+3 adenocarcinoma of the prostate in 2 of 15 cores, both on the right. All three samples from the ROI were benign. We initially met with the patient on 06/27/23 to discuss treatment options and he ultimately elected to proceed with active surveillance.  He has continued in close follow up with Dr. Devere and a surveillance prostate MRI performed on 10/03/23 showed a stable PIRADS 4 lesion in the right anterior peripheral zone at the apex. PSA increased to 5.6 in 11/2023 so a surveillance MRI fusion biopsy of the prostate was performed on 02/16/24. The prostate volume measured 49 g. Out of 16 cores, 3 were positive. The highest Gleason score was 3+4 and was seen in the right apex and MRI ROI. Additionally, Gleason  3+3 was seen in the left apex lateral.  The patient reviewed the biopsy, imaging and PSA results with his urologist and he has kindly been referred today for further discussion of his treatment options. He is accompanied by his wife, Adam Rosales, for today's visit.   PREVIOUS RADIATION THERAPY: No  PAST MEDICAL HISTORY:  Past Medical History:  Diagnosis Date   Cellulitis of buttock 2022   Dyslipidemia    Elevated PSA    History of kidney stones    Hyperlipidemia    Hypertension    Pre-diabetes    Scrotal abscess 2022      PAST SURGICAL HISTORY: Past Surgical History:  Procedure Laterality Date   COLONOSCOPY  2009   Dr.Mann   INGUINAL HERNIA REPAIR Left    INGUINAL HERNIA REPAIR Right 12/16/2020   Procedure: RECURRENT OPEN RIGHT INGUINAL HERNIA REPAIR WITH MESH;  Surgeon: Lyndel Deward PARAS, MD;  Location: [facility];  Service: General;  Laterality: Right;   PROSTATE BIOPSY  06/06/2023   SHX1 Right 05/01/2021   junctional nevus with moderate atypia    FAMILY HISTORY:  Family History  Problem Relation Age of Onset   Arthritis Mother    Heart disease Father 33   Heart attack Father    Prostate cancer Brother    Colon cancer Brother    Other Brother        Agent orange   Heart disease Maternal Grandfather    Heart disease Paternal Grandfather    Lung cancer  Paternal Grandfather     SOCIAL HISTORY: 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 Social History   Socioeconomic History   Marital status: Married    Spouse name: Not on file   Number of children: 2   Years of education: Not on file   Highest education level: Doctorate  Occupational History   Not on file  Tobacco Use   Smoking status: Never   Smokeless tobacco: Never  Vaping Use   Vaping status: Never Used  Substance and Sexual Activity   Alcohol use: Yes    Alcohol/week: 1.0 standard drink of alcohol    Types: 1 Glasses of wine per week    Comment: less than 1 per day   Drug use: No    Sexual activity: Yes  Other Topics Concern   Not on file  Social History Narrative   Not on file   Social Drivers of Health   Financial Resource Strain: Low Risk  (02/13/2024)   Overall Financial Resource Strain (CARDIA)    Difficulty of Paying Living Expenses: Not hard at all  Food Insecurity: No Food Insecurity (02/13/2024)   Hunger Vital Sign    Worried About Running Out of Food in the Last Year: Never true    Ran Out of Food in the Last Year: Never true  Transportation Needs: No Transportation Needs (02/13/2024)   PRAPARE - Administrator, Civil Service (Medical): No    Lack of Transportation (Non-Medical): No  Physical Activity: Sufficiently Active (02/13/2024)   Exercise Vital Sign    Days of Exercise per Week: 7 days    Minutes of Exercise per Session: 140 min  Stress: No Stress Concern Present (02/13/2024)   [employer] of Occupational Health - Occupational Stress Questionnaire    Feeling of Stress: Only a little  Social Connections: Moderately Integrated (02/13/2024)   Social Connection and Isolation Panel    Frequency of Communication with Friends and Family: More than three times a week    Frequency of Social Gatherings with Friends and Family: More than three times a week    Attends Religious Services: Never    Database Administrator or Organizations: Yes    Attends Engineer, Structural: More than 4 times per year    Marital Status: Married  Catering Manager Violence: Not At Risk (11/08/2023)   Humiliation, Afraid, Rape, and Kick questionnaire    Fear of Current or Ex-Partner: No    Emotionally Abused: No    Physically Abused: No    Sexually Abused: No    ALLERGIES: Tamsulosin   MEDICATIONS:  Current Outpatient Medications  Medication Sig Dispense Refill   aspirin EC 81 MG tablet Take 1 tablet (81 mg total) by mouth daily. Swallow whole. 90 tablet 2   atorvastatin  (LIPITOR) 40 MG tablet Take 1 tablet (40 mg total) by mouth daily. 90  tablet 0   ibuprofen  (ADVIL ) 200 MG tablet Take 200 mg by mouth every 8 (eight) hours as needed (for pain.).     Multiple Vitamin (MULTIVITAMIN) capsule Take 1 capsule by mouth daily.     nitroGLYCERIN (NITROSTAT) 0.4 MG SL tablet Place 1 tablet (0.4 mg total) under the tongue every 5 (five) minutes as needed for chest pain. 25 tablet 3   tadalafil (CIALIS) 5 MG tablet Take 5 mg by mouth daily.     No current facility-administered medications for this encounter.    REVIEW OF SYSTEMS:  On review of systems, the patient  reports that he is doing well overall. He denies any chest pain, shortness of breath, cough, fevers, chills, night sweats, unintended weight changes. He denies any bowel disturbances, and denies abdominal pain, nausea or vomiting. He denies any new musculoskeletal or joint aches or pains. His IPSS was 16, indicating moderate urinary symptoms with frequency and urgency but no obstructive symptoms. He was unable to tolerate Flomax  previously due to severe orthostatic hypotension. His SHIM was 10, indicating he has moderate erectile dysfunction. A complete review of systems is obtained and is otherwise negative.    PHYSICAL EXAM:  Wt Readings from Last 3 Encounters:  03/15/24 207 lb 9.6 oz (94.2 kg)  02/14/24 204 lb 12.8 oz (92.9 kg)  02/09/24 206 lb 12.8 oz (93.8 kg)   Temp Readings from Last 3 Encounters:  03/15/24 (!) 97.5 F (36.4 C)  11/09/23 97.9 F (36.6 C) (Oral)  06/27/23 97.8 F (36.6 C)   BP Readings from Last 3 Encounters:  03/15/24 137/72  02/14/24 (!) 142/88  02/09/24 (!) 144/82   Pulse Readings from Last 3 Encounters:  03/15/24 69  02/14/24 71  02/09/24 (!) 59   Pain Assessment Pain Score: 0-No pain/10  In general this is a well appearing Caucasian male in no acute distress. He's alert and oriented x4 and appropriate throughout the examination. Cardiopulmonary assessment is negative for acute distress, and he exhibits normal effort.     KPS =  100  100 - Normal; no complaints; no evidence of disease. 90   - Able to carry on normal activity; minor signs or symptoms of disease. 80   - Normal activity with effort; some signs or symptoms of disease. 67   - Cares for self; unable to carry on normal activity or to do active work. 60   - Requires occasional assistance, but is able to care for most of his personal needs. 50   - Requires considerable assistance and frequent medical care. 40   - Disabled; requires special care and assistance. 30   - Severely disabled; hospital admission is indicated although death not imminent. 20   - Very sick; hospital admission necessary; active supportive treatment necessary. 10   - Moribund; fatal processes progressing rapidly. 0     - Dead  Karnofsky DA, Abelmann WH, Craver LS and Burchenal [facility] [phone]) The use of the nitrogen mustards in the palliative treatment of carcinoma: with particular reference to bronchogenic carcinoma Cancer 1 634-56  LABORATORY DATA:  Lab Results  Component Value Date   WBC 6.9 02/09/2024   HGB 16.2 02/09/2024   HCT 49.0 02/09/2024   MCV 95 02/09/2024   PLT 258 02/09/2024   Lab Results  Component Value Date   NA 141 02/09/2024   K 4.1 02/09/2024   CL 103 02/09/2024   CO2 24 02/09/2024   Lab Results  Component Value Date   ALT 19 02/09/2023   AST 24 02/09/2023   ALKPHOS 58 02/09/2023   BILITOT 0.8 02/09/2023     RADIOGRAPHY: No results found.    IMPRESSION/PLAN: 1. 73 y.o. gentleman with Stage T1c adenocarcinoma of the prostate with Gleason Score of 3+4, and PSA of 5.6. We discussed the patient's workup and outlined the nature of prostate cancer in this setting. The patient's T stage, Gleason's score, and PSA put him into the favorable intermediate risk group. Accordingly, he is eligible for a variety of potential treatment options including continued active surveillance, brachytherapy, 5.5 weeks of external radiation, or prostatectomy. We discussed the  available radiation techniques, and focused on the details and logistics of delivery. We discussed and outlined the risks, benefits, short and long-term effects associated with radiotherapy and compared and contrasted these with prostatectomy. We discussed the role of SpaceOAR gel in reducing the rectal toxicity associated with radiotherapy.  He appears to have a good understanding of his disease and our treatment recommendations which are of curative intent.  He and his wife were encouraged to ask questions that were answered to their stated satisfaction.  At the conclusion of our conversation, the patient is interested in moving forward with brachytherapy and use of SpaceOAR gel to reduce rectal toxicity from radiotherapy.  We will share our discussion with Dr. Devere and move forward with scheduling his CT [facility] planning appointment in the near future.  The patient met briefly with Orlean Gunner in our office who will be working closely with him to coordinate OR scheduling and pre and post procedure appointments.  We will contact the pharmaceutical rep to ensure that SpaceOAR is available at the time of procedure.  We enjoyed meeting him and his wife, Adam Rosales today and look forward to continuing to participate in his care.   We personally spent 40 minutes in this encounter including chart review, reviewing radiological studies, meeting face-to-face with the patient, entering orders and completing documentation.    Sabra MICAEL Rusk, PA-C    Donnice Barge, MD  [facility]  Radiation Oncology Direct Dial: [phone]  Fax: [phone] [email or web address]  Skype  LinkedIn

## 2024-03-21 ENCOUNTER — Telehealth: Payer: Self-pay | Admitting: *Deleted

## 2024-03-21 NOTE — Telephone Encounter (Signed)
CALLED PATIENT TO UPDATE, SPOKE WITH PATIENT 

## 2024-03-27 ENCOUNTER — Encounter: Payer: Self-pay | Admitting: Family Medicine

## 2024-03-27 ENCOUNTER — Ambulatory Visit: Payer: Self-pay | Admitting: Family Medicine

## 2024-03-27 VITALS — BP 155/83 | HR 84 | Resp 98 | Wt 209.8 lb

## 2024-03-27 DIAGNOSIS — I10 Essential (primary) hypertension: Secondary | ICD-10-CM

## 2024-03-27 MED ORDER — LISINOPRIL 10 MG PO TABS: 10.0000 mg | ORAL_TABLET | Freq: Every day | ORAL | 3 refills | Status: AC

## 2024-03-27 NOTE — Progress Notes (Signed)
 Name: Adam Rosales   Date of Visit: 03/27/24   Date of last visit with me: 02/14/2024   CHIEF COMPLAINT:  Chief Complaint  Patient presents with   Follow-up    6 week follow up on blood pressure and stress test.        HPI:  Discussed the use of AI scribe software for clinical note transcription with the patient, who gave verbal consent to proceed.  History of Present Illness   Adam Rosales is a 73 year old male with hypertension and coronary artery disease who presents for blood pressure management and follow-up on coronary calcium  findings.  He has been monitoring his blood pressure over the last 30 days, with morning and evening readings averaging 125/71 mmHg. The highest recorded systolic pressure was 138 mmHg. He consumes caffeine, which may have contributed to the elevated readings. His average pulse rate is 59 bpm.  He had a CT scan that showed elevated coronary calcium  levels. He reports having a stress test after the CT scan.  He is scheduled to undergo seed radiation therapy for prostate cancer in January or February. His prostate-specific antigen (PSA) levels increased from 1 to 2 over a five-month period. He has discussed this with a radiation oncologist.  He has previously experienced a significant drop in blood pressure with tamsulosin , which led to discontinuation of the medication. He recalls being on a blood pressure medication in the past, which did not cause a significant drop in his blood pressure. He is cautious about starting new medications due to this history.  In terms of physical activity, he walks 8-9 miles a day and used to run two 12-minute miles on a treadmill. He has reduced his running due to concerns about his heart but continues to walk regularly. He has also started weight training three times a week.         OBJECTIVE:       03/15/2024    2:21 PM  Depression screen PHQ 2/9  Decreased Interest 0  Down, Depressed, Hopeless 0  PHQ  - 2 Score 0     BP Readings from Last 3 Encounters:  03/27/24 (!) 155/83  03/15/24 137/72  02/14/24 (!) 142/88    BP (!) 155/83   Pulse 84   Resp (!) 98   Wt 209 lb 12.8 oz (95.2 kg)   BMI 30.10 kg/m    Physical Exam   VITALS: BP- 155/83      Physical Exam Constitutional:      Appearance: Normal appearance.  Neurological:     General: No focal deficit present.     Mental Status: He is alert and oriented to person, place, and time. Mental status is at baseline.     ASSESSMENT/PLAN:   Assessment & Plan Primary hypertension    Assessment and Plan    Essential hypertension Blood pressure averages 120s/70s with occasional spikes to 155/83. Lisinopril considered for kidney protection and slight blood pressure reduction. Previous adverse reaction to tamsulosin  noted, but lisinopril is not related. - Started lisinopril for blood pressure reduction and kidney protection. - Advised to have wife present when starting lisinopril due to potential blood pressure drop.  Prostate cancer, planned radiation therapy PSA increased from 1 to 2 over five months. Seed radiation therapy planned and deemed appropriate by radiation oncologist. - Proceed with planned seed radiation therapy for prostate cancer.  Coronary artery calcification, post-screening Coronary artery calcification identified on CT. Normal stress test indicates no current symptoms. -  No further intervention required at this time.         Jaqualyn Juday A. Vita MD [facility]

## 2024-03-28 ENCOUNTER — Other Ambulatory Visit: Payer: Self-pay | Admitting: Urology

## 2024-03-28 ENCOUNTER — Telehealth: Payer: Self-pay | Admitting: Cardiology

## 2024-03-28 NOTE — Telephone Encounter (Signed)
   Pre-operative Risk Assessment    Patient Name: Adam Rosales  DOB: February 07, 1951 MRN: [id number]   Date of last office visit: 02/09/24 Date of next office visit: 03/29/24   Request for Surgical Clearance    Procedure:  Radioactive Seed Implants and Space OAR   Date of Surgery:  Clearance 04/28/24                                Surgeon:  Dr. Lonni Han Surgeon's Group or Practice Name:  [facility] Phone number:  [phone]  Fax number:  [phone]   Type of Clearance Requested:   - Medical  - Pharmacy:  Hold Aspirin      Type of Anesthesia:  General    Additional requests/questions:  Caller [facility]) stated they will need patient Asprin held 5 days prior to procedure.  Signed, Jasmin B Wilson   03/28/2024, 4:49 PM

## 2024-03-28 NOTE — Telephone Encounter (Signed)
 Dr. Elmira You recently saw this patient 02/09/24. He underwent nuclear stress that that was nonischemic. Does he need further cardiac workup prior to prostate seed implant?

## 2024-03-29 ENCOUNTER — Ambulatory Visit: Attending: Cardiology | Admitting: Cardiology

## 2024-03-29 ENCOUNTER — Telehealth: Payer: Self-pay | Admitting: *Deleted

## 2024-03-29 ENCOUNTER — Encounter: Payer: Self-pay | Admitting: Cardiology

## 2024-03-29 VITALS — BP 148/90 | HR 79 | Ht 70.0 in | Wt 210.4 lb

## 2024-03-29 DIAGNOSIS — Z01810 Encounter for preprocedural cardiovascular examination: Secondary | ICD-10-CM

## 2024-03-29 DIAGNOSIS — I25118 Atherosclerotic heart disease of native coronary artery with other forms of angina pectoris: Secondary | ICD-10-CM | POA: Diagnosis not present

## 2024-03-29 DIAGNOSIS — E782 Mixed hyperlipidemia: Secondary | ICD-10-CM | POA: Diagnosis not present

## 2024-03-29 NOTE — Telephone Encounter (Signed)
 Seeing him today. Low cardaic risk. Okay to proceed with prostate seed implant.  Thanks MJP

## 2024-03-29 NOTE — Progress Notes (Signed)
 Cardiology Office Note:  .   Date:  03/29/2024  ID:  Adam Rosales, DOB 1950/07/01, MRN [id number] PCP: Vita Morrow, MD  [facility] Providers Cardiologist:  Newman Lawrence, MD PCP: Vita Morrow, MD  Chief Complaint  Patient presents with   Coronary Artery Disease     Adam Rosales is a 73 y.o. male with coronary calcification, prostate cancer  Discussed the use of AI scribe software for clinical note transcription with the patient, who gave verbal consent to proceed.  History of Present Illness Patient is doing well.  Recent stress test results reviewed with patient, details below.  Patient is going to undergo radioactive seed implantation for prostate cancer.     Vitals:   03/29/24 1347  BP: (!) 148/90  Pulse: 79  SpO2: 97%       Review of Systems  Cardiovascular:  Positive for chest pain. Negative for dyspnea on exertion, leg swelling, palpitations and syncope.        Studies Reviewed: SABRA        EKG 02/09/2024: Sinus rhythm 59 bpm Non-specific intra-ventricular conduction delay Nonspecific T wave abnormality When compared with ECG of When compared with ECG of 12-Dec-2020 09:31,QRS axis Shifted right  Stress test 01/2024:   The study is normal. The study is low risk.   LV perfusion is normal. There is no evidence of ischemia. There is no evidence of infarction.   Left ventricular function is normal. Nuclear stress EF: 58%. The left ventricular ejection fraction is normal (55-65%). End diastolic cavity size is normal.   Coronary calcium  was present on the attenuation correction CT images. Moderate coronary calcifications were present. Coronary calcifications were present in the left anterior descending artery and left circumflex artery distribution(s).    CT cardiac scoring 12/2023: Coronary arteries: Normal origins. Coronary Calcium  Score: Left main: 12.5 Left anterior descending artery: 579 Left circumflex artery: 46.1 Right coronary  artery: 9.46   Total: 647 Percentile: 75th   Pericardium: Normal. Ascending Aorta: Mildly dilated ascending aorta measuring 39mm at the level of the bifurcation of the main pulmonary artery.  Labs 01/2024: Chol 147, TG 161, HDL 49, LDL 71 HbA1C 5.4% Hb 16.2 Cr 0.75   Labs 01/2023: Chol 124, TG 91, HDL 51, LDL 56 HbA1C 5.5% Hb 17.3 Cr 0.9    Physical Exam Vitals and nursing note reviewed.  Constitutional:      General: He is not in acute distress. Neck:     Vascular: No JVD.  Cardiovascular:     Rate and Rhythm: Normal rate and regular rhythm.     Heart sounds: Normal heart sounds. No murmur heard. Pulmonary:     Effort: Pulmonary effort is normal.     Breath sounds: Normal breath sounds. No wheezing or rales.  Musculoskeletal:     Right lower leg: No edema.     Left lower leg: No edema.      VISIT DIAGNOSES:   ICD-10-CM   1. Mixed hyperlipidemia  E78.2 Lipid panel    Lipoprotein A (LPA)    2. Coronary artery disease of native artery of native heart with stable angina pectoris  I25.118 Lipid panel    Lipoprotein A (LPA)    3. Preop cardiovascular exam  Z01.810         Adam Rosales is a 73 y.o. male with coronary calcification Assessment and Plan Assessment & Plan CAD: 75th percentile coronary calcification noted on CT cardiac scoring scan (12/2023). No ischemia on stress testing (02/2024).  Occasional chest pain likely musculoskeletal. Regardless, reasonable to continue aspirin  81 mg daily given high calcification and no bleeding issues.  Suspected polycythemia: Hemoglobin previously 17, now 16.  Defer to PCP if this warrants further workup or may be a normal variant.  Mixed hyperlipidemia: LDL 71 before initiation of Lipitor. Check lipid panel and lipoprotein a today.  [place] hypertension: Started on lisinopril 10 mg daily acid by PCP.  Continue regular home blood pressure checks.    Preop risk stratification: Low cardiac risk for  upcoming prostate procedure.  Okay to hold aspirin .    F/u in 6 months.  After that, anticipate as needed follow-up.  Signed, Newman JINNY Lawrence, MD

## 2024-03-29 NOTE — Telephone Encounter (Signed)
   Patient Name: Adam Rosales  DOB: Jun 22, 1950 MRN: [id number]  Primary Cardiologist: Newman JINNY Egan Sahlin, MD  Chart reviewed as part of pre-operative protocol coverage. Given past medical history and time since last visit, based on ACC/AHA guidelines, DUBLIN GRAYER is at acceptable risk for the planned procedure without further cardiovascular testing.   Per Dr. Sadat Sliwa: 03/29/2024 Seeing him today. Low cardaic risk. Okay to proceed with prostate seed implant.   Thanks MJP  Regarding ASA therapy, we recommend continuation of ASA throughout the perioperative period.  However, if the surgeon feels that cessation of ASA is required in the perioperative period, it may be stopped 5-7 days prior to surgery with a plan to resume it as soon as felt to be feasible from a surgical standpoint in the post-operative period.   The patient was advised that if he develops new symptoms prior to surgery to contact our office to arrange for a follow-up visit, and he verbalized understanding.  I will route this recommendation to the requesting party via Epic fax function and remove from pre-op pool.  Please call with questions.  Lamarr Satterfield, NP 03/29/2024, 7:48 AM

## 2024-03-29 NOTE — Telephone Encounter (Signed)
Called patient to inform of pre-seed appts. and implant date, spoke with patient and he is aware of these appts.

## 2024-03-29 NOTE — Patient Instructions (Signed)
  Lab Work: Lipid panel  LP(a)  If you have labs (blood work) drawn today and your tests are completely normal, you will receive your results only by: MyChart Message (if you have MyChart) OR A paper copy in the mail If you have any lab test that is abnormal or we need to change your treatment, we will call you to review the results.    Follow-Up: At [facility], you and your health needs are our priority.  As part of our continuing mission to provide you with exceptional heart care, our providers are all part of one team.  This team includes your primary Cardiologist (physician) and Advanced Practice Providers or APPs (Physician Assistants and Nurse Practitioners) who all work together to provide you with the care you need, when you need it.  Your next appointment:   6 month(s)  Provider:   Newman JINNY Lawrence, MD

## 2024-03-30 ENCOUNTER — Ambulatory Visit: Payer: Self-pay | Admitting: Cardiology

## 2024-03-30 LAB — LIPOPROTEIN A (LPA): Lipoprotein (a): <8.4 nmol/L (ref <75.0)

## 2024-03-30 LAB — LIPID PANEL
Chol/HDL Ratio: 2.5 ratio (ref 0.0–5.0)
Cholesterol, Total: 129 mg/dL (ref 100–199)
HDL: 51 mg/dL (ref >39)
LDL Chol Calc (NIH): 59 mg/dL (ref 0–99)
Triglycerides: 101 mg/dL (ref 0–149)
VLDL Cholesterol Cal: 19 mg/dL (ref 5–40)

## 2024-04-03 NOTE — Telephone Encounter (Signed)
 There is no 60 mg Lipitor available. I am okay if you chose to be on either 40 mg or 80 mg daily.  Thanks MJP

## 2024-04-03 NOTE — Telephone Encounter (Signed)
 Please advise concerning dosage of Lipitor.

## 2024-04-04 MED ORDER — ATORVASTATIN CALCIUM 80 MG PO TABS: 80.0000 mg | ORAL_TABLET | Freq: Every day | ORAL | 3 refills | Status: AC

## 2024-04-13 NOTE — Progress Notes (Signed)
 Pre-seed nursing interview for a diagnosis of 73 y.o. gentleman with Stage T1c adenocarcinoma of the prostate with Gleason score of 3+3, and PSA of 4.93.   Patient identity verified x2.   Patient states issues as follows...  -Pain: None -Fatigue: No -Abdomen: No -Groin: No -Urinary: No -Bowels: No -Appetite: Normal -Weight: 205.6  Patient denies all other related issues at this time.  Meaningful use complete.  Urinary Management medication(s)- None Urology appointment date- 05/08/24, with Dr. Lonni Devere   No vitals needed for this visit.  This concludes the interaction.

## 2024-04-25 ENCOUNTER — Telehealth: Payer: Self-pay | Admitting: *Deleted

## 2024-04-25 NOTE — Telephone Encounter (Signed)
 Called patient to remind of pre-seed appts. for 04-27-24, spoke with patient and he is aware of these appts.

## 2024-04-27 ENCOUNTER — Ambulatory Visit
Admission: RE | Admit: 2024-04-27 | Discharge: 2024-04-27 | Disposition: A | Discharge status: Home / Self Care | Source: Ambulatory Visit | Attending: Urology | Admitting: Urology

## 2024-04-27 ENCOUNTER — Ambulatory Visit
Admission: RE | Admit: 2024-04-27 | Discharge: 2024-04-27 | Disposition: A | Discharge status: Home / Self Care | Source: Ambulatory Visit | Attending: Radiation Oncology | Admitting: Radiation Oncology

## 2024-04-27 DIAGNOSIS — C61 Malignant neoplasm of prostate: Secondary | ICD-10-CM

## 2024-04-27 DIAGNOSIS — Z510 Encounter for antineoplastic radiation therapy: Secondary | ICD-10-CM | POA: Diagnosis present

## 2024-04-27 NOTE — Progress Notes (Signed)
" °  Radiation Oncology         [phone] ________________________________  Name: SHAFTER JUPIN MRN: [id number]  Date: 04/27/2024  DOB: 1950/05/26  SIMULATION AND TREATMENT PLANNING NOTE PUBIC ARCH STUDY Definitive Seed Implant as Monotherapy  CC:Jha, Panav, MD  Devere Lonni Righter, MD  DIAGNOSIS: 74 y.o. gentleman with Stage T1c adenocarcinoma of the prostate with Gleason score of 3+4, and PSA of 5.6.   Oncology History  Malignant neoplasm of prostate (HCC)  06/26/2023 Initial Diagnosis   Malignant neoplasm of prostate (HCC)   02/16/2024 Cancer Staging   Staging form: Prostate, AJCC 8th Edition - Clinical stage from 02/16/2024: Stage IIB (cT1c, cN0, cM0, PSA: 5.6, Grade Group: 2) - Signed by Sherwood Rise, PA-C on 03/15/2024 Histopathologic type: Adenocarcinoma, NOS Stage prefix: Initial diagnosis Prostate specific antigen (PSA) range: Less than 10 Gleason primary pattern: 3 Gleason secondary pattern: 4 Gleason score: 7 Histologic grading system: 5 grade system Number of biopsy cores examined: 16 Number of biopsy cores positive: 3 Location of positive needle core biopsies: Both sides       ICD-10-CM   1. Malignant neoplasm of prostate (HCC)  C61       COMPLEX SIMULATION:  The patient presented today for evaluation for possible prostate seed implant. He was brought to the radiation planning suite and placed supine on the CT couch. A 3-dimensional image study set was obtained in upload to the planning computer. There, on each axial slice, I contoured the prostate gland. Then, using three-dimensional radiation planning tools I reconstructed the prostate in view of the structures from the transperineal needle pathway to assess for possible pubic arch interference. In doing so, I did not appreciate any pubic arch interference. Also, the patient's prostate volume was estimated based on the drawn structure. The volume was 47 cc.  Given the pubic arch appearance and prostate  volume, patient remains a good candidate to proceed with prostate seed implant. Today, he freely provided informed written consent to proceed.    PLAN: The patient will undergo prostate seed implant to 145 Gy.   ________________________________  Donnice LABOR. Patrcia, M.D.     "

## 2024-04-27 NOTE — Progress Notes (Signed)
 " Radiation Oncology         [phone] ________________________________  Outpatient Follow up- Pre-seed visit  Name: Adam Rosales MRN: [id number]  Date: 04/27/2024  DOB: June 13, 1950  CC:Jha, Reyne, MD  Devere Lonni Righter, MD   REFERRING PHYSICIAN: Devere Lonni Righter, MD  DIAGNOSIS: 74 y.o. gentleman with  Stage T1c adenocarcinoma of the prostate with Gleason score of 3+4, and PSA of 5.6.     ICD-10-CM   1. Malignant neoplasm of prostate (HCC)  C61       HISTORY OF PRESENT ILLNESS: Adam Rosales is a 74 y.o. male with a diagnosis of prostate cancer.  He has a history of scrotal abscess managed by Dr. Devere in 2022. More recently, he was noted to have an elevated PSA of 5.7 by his primary care physician, Dr. Joyce.  Accordingly, he was referred for evaluation in urology by Sherlyn Moats, NP on 02/21/23,  digital rectal examination performed at that time showed no nodules or induration. A repeat PSA obtained that day showed a slight decrease but persistent elevation at 4.93. He underwent prostate MRI on 04/18/23 showing a PI-RADS 4 lesion in the right anterior peripheral zone at the apex. The patient proceeded to MRI fusion biopsy of the prostate on 06/06/23 which confirmed Gleason 3+3 adenocarcinoma of the prostate in 2 of 15 cores, both on the right. All three samples from the ROI were benign. We initially met with the patient on 06/27/23 to discuss treatment options and he ultimately elected to proceed with active surveillance.   He has continued in close follow up with Dr. Devere and a surveillance prostate MRI performed on 10/03/23 showed a stable PIRADS 4 lesion in the right anterior peripheral zone at the apex. PSA increased to 5.6 in 11/2023 so a surveillance MRI fusion biopsy of the prostate was performed on 02/16/24. The prostate volume measured 49 g. Out of 16 cores, 3 were positive. The highest Gleason score was 3+4 and was seen in the right apex and MRI ROI.  Additionally, Gleason 3+3 was seen in the left apex lateral.  The patient reviewed the biopsy results with his urologist and was kindly referred to us  for discussion of potential radiation treatment options. We initially met the patient on 03/15/24 and he was most interested in proceeding with brachytherapy and SpaceOAR gel placement for treatment of his disease. He is here today for his pre-procedure imaging for planning and to answer any additional questions he may have about this treatment.   PREVIOUS RADIATION THERAPY: No  PAST MEDICAL HISTORY:  Past Medical History:  Diagnosis Date   Cellulitis of buttock 2022   Dyslipidemia    Elevated PSA    History of kidney stones    Hyperlipidemia    Hypertension    Pre-diabetes    Scrotal abscess 2022      PAST SURGICAL HISTORY: Past Surgical History:  Procedure Laterality Date   COLONOSCOPY  2009   Dr.Mann   INGUINAL HERNIA REPAIR Left    INGUINAL HERNIA REPAIR Right 12/16/2020   Procedure: RECURRENT OPEN RIGHT INGUINAL HERNIA REPAIR WITH MESH;  Surgeon: Lyndel Deward PARAS, MD;  Location: [facility];  Service: General;  Laterality: Right;   PROSTATE BIOPSY  06/06/2023   SHX1 Right 05/01/2021   junctional nevus with moderate atypia    FAMILY HISTORY:  Family History  Problem Relation Age of Onset   Arthritis Mother    Heart disease Father 70   Heart attack Father  Prostate cancer Brother    Colon cancer Brother    Other Brother        Agent orange   Heart disease Maternal Grandfather    Heart disease Paternal Grandfather    Lung cancer Paternal Grandfather     SOCIAL HISTORY:  Social History   Socioeconomic History   Marital status: Married    Spouse name: Not on file   Number of children: 2   Years of education: Not on file   Highest education level: Doctorate  Occupational History   Not on file  Tobacco Use   Smoking status: Never   Smokeless tobacco: Never  Vaping Use   Vaping status: Never Used  Substance  and Sexual Activity   Alcohol use: Yes    Alcohol/week: 1.0 standard drink of alcohol    Types: 1 Glasses of wine per week    Comment: less than 1 per day   Drug use: No   Sexual activity: Yes  Other Topics Concern   Not on file  Social History Narrative   Not on file   Social Drivers of Health   Tobacco Use: Low Risk (03/29/2024)   Patient History    Smoking Tobacco Use: Never    Smokeless Tobacco Use: Never    Passive Exposure: Not on file  Financial Resource Strain: Low Risk (02/13/2024)   Overall Financial Resource Strain (CARDIA)    Difficulty of Paying Living Expenses: Not hard at all  Food Insecurity: No Food Insecurity (02/13/2024)   Epic    Worried About Programme Researcher, Broadcasting/film/video in the Last Year: Never true    Ran Out of Food in the Last Year: Never true  Transportation Needs: No Transportation Needs (02/13/2024)   Epic    Lack of Transportation (Medical): No    Lack of Transportation (Non-Medical): No  Physical Activity: Sufficiently Active (02/13/2024)   Exercise Vital Sign    Days of Exercise per Week: 7 days    Minutes of Exercise per Session: 140 min  Stress: No Stress Concern Present (02/13/2024)   [employer] of Occupational Health - Occupational Stress Questionnaire    Feeling of Stress: Only a little  Social Connections: Moderately Integrated (02/13/2024)   Social Connection and Isolation Panel    Frequency of Communication with Friends and Family: More than three times a week    Frequency of Social Gatherings with Friends and Family: More than three times a week    Attends Religious Services: Never    Database Administrator or Organizations: Yes    Attends Engineer, Structural: More than 4 times per year    Marital Status: Married  Catering Manager Violence: Not At Risk (03/15/2024)   Epic    Fear of Current or Ex-Partner: No    Emotionally Abused: No    Physically Abused: No    Sexually Abused: No  Depression (PHQ2-9): Low Risk  (03/15/2024)   Depression (PHQ2-9)    PHQ-2 Score: 0  Alcohol Screen: Low Risk (02/13/2024)   Alcohol Screen    Last Alcohol Screening Score (AUDIT): 4  Housing: Low Risk (02/13/2024)   Epic    Unable to Pay for Housing in the Last Year: No    Number of Times Moved in the Last Year: 0    Homeless in the Last Year: No  Utilities: Not At Risk (11/08/2023)   Epic    Threatened with loss of utilities: No  Health Literacy: Adequate Health Literacy (11/08/2023)   B1300  Health Literacy    Frequency of need for help with medical instructions: Never    ALLERGIES: Tamsulosin   MEDICATIONS:  Current Outpatient Medications  Medication Sig Dispense Refill   aspirin  EC 81 MG tablet Take 1 tablet (81 mg total) by mouth daily. Swallow whole. 90 tablet 2   atorvastatin  (LIPITOR) 80 MG tablet Take 1 tablet (80 mg total) by mouth daily. 90 tablet 3   ibuprofen  (ADVIL ) 200 MG tablet Take 200 mg by mouth every 8 (eight) hours as needed (for pain.).     lisinopril  (ZESTRIL ) 10 MG tablet Take 1 tablet (10 mg total) by mouth daily. 90 tablet 3   Multiple Vitamin (MULTIVITAMIN) capsule Take 1 capsule by mouth daily.     nitroGLYCERIN  (NITROSTAT ) 0.4 MG SL tablet Place 1 tablet (0.4 mg total) under the tongue every 5 (five) minutes as needed for chest pain. 25 tablet 3   tadalafil (CIALIS) 5 MG tablet Take 5 mg by mouth daily.     No current facility-administered medications for this visit.    REVIEW OF SYSTEMS:   On review of systems, the patient reports that he is doing well overall. He denies any chest pain, shortness of breath, cough, fevers, chills, night sweats, unintended weight changes. He denies any bowel disturbances, and denies abdominal pain, nausea or vomiting. He denies any new musculoskeletal or joint aches or pains. His IPSS was 16, indicating moderate urinary symptoms with frequency and urgency but no obstructive symptoms. He was unable to tolerate Flomax  previously due to severe orthostatic  hypotension. His SHIM was 10, indicating he has moderate erectile dysfunction. A complete review of systems is obtained and is otherwise negative.      PHYSICAL EXAM:  Wt Readings from Last 3 Encounters:  03/29/24 210 lb 6.4 oz (95.4 kg)  03/27/24 209 lb 12.8 oz (95.2 kg)  03/15/24 207 lb 9.6 oz (94.2 kg)   Temp Readings from Last 3 Encounters:  03/15/24 (!) 97.5 F (36.4 C)  11/09/23 97.9 F (36.6 C) (Oral)  06/27/23 97.8 F (36.6 C)   BP Readings from Last 3 Encounters:  03/29/24 (!) 148/90  03/27/24 (!) 155/83  03/15/24 137/72   Pulse Readings from Last 3 Encounters:  03/29/24 79  03/27/24 84  03/15/24 69    /10  In general this is a well appearing Caucasian male in no acute distress. He's alert and oriented x4 and appropriate throughout the examination. Cardiopulmonary assessment is negative for acute distress, and he exhibits normal effort.     KPS = 100  100 - Normal; no complaints; no evidence of disease. 90   - Able to carry on normal activity; minor signs or symptoms of disease. 80   - Normal activity with effort; some signs or symptoms of disease. 33   - Cares for self; unable to carry on normal activity or to do active work. 60   - Requires occasional assistance, but is able to care for most of his personal needs. 50   - Requires considerable assistance and frequent medical care. 40   - Disabled; requires special care and assistance. 30   - Severely disabled; hospital admission is indicated although death not imminent. 20   - Very sick; hospital admission necessary; active supportive treatment necessary. 10   - Moribund; fatal processes progressing rapidly. 0     - Dead  Karnofsky DA, Abelmann WH, Craver LS and Burchenal [facility] [phone]) The use of the nitrogen mustards in the palliative treatment of carcinoma: with  particular reference to bronchogenic carcinoma Cancer 1 [phone]  LABORATORY DATA:  Lab Results  Component Value Date   WBC 6.9 02/09/2024   HGB  16.2 02/09/2024   HCT 49.0 02/09/2024   MCV 95 02/09/2024   PLT 258 02/09/2024   Lab Results  Component Value Date   NA 141 02/09/2024   K 4.1 02/09/2024   CL 103 02/09/2024   CO2 24 02/09/2024   Lab Results  Component Value Date   ALT 19 02/09/2023   AST 24 02/09/2023   ALKPHOS 58 02/09/2023   BILITOT 0.8 02/09/2023     RADIOGRAPHY: No results found.    IMPRESSION/PLAN: 1. 74 y.o. gentleman with  Stage T1c adenocarcinoma of the prostate with Gleason score of 3+4, and PSA of 5.6.  The patient has elected to proceed with seed implant for treatment of his disease. We reviewed the risks, benefits, short and long-term effects associated with brachytherapy and discussed the role of SpaceOAR in reducing the rectal toxicity associated with radiotherapy.  He appears to have a good understanding of his disease and our treatment recommendations which are of curative intent.  He was encouraged to ask questions that were answered to his stated satisfaction. He has freely signed written consent to proceed today in the office and a copy of this document will be placed in his medical record. His procedure is tentatively scheduled for 05/15/24 in collaboration with Dr. Devere and we will see him back for his post-procedure visit approximately 3 weeks thereafter. We look forward to continuing to participate in his care. He knows that he is welcome to call with any questions or concerns at any time in the interim.  I personally spent 30 minutes in this encounter including chart review, reviewing radiological studies, meeting face-to-face with the patient, entering orders and completing documentation.    Sabra MICAEL Rusk, MMS, PA-C   [facility] Radiation Oncology Physician Assistant Direct Dial: [phone]  Fax: [phone]     "

## 2024-05-07 NOTE — Progress Notes (Signed)
 Date of COVID positive in last 90 days:  PCP - Reyne Bustle, MD Cardiologist - Newman Lawrence, MD LOV 03/29/24  Cardiac clearance by Dr. Lawrence 03/29/24 in Epic  Chest x-ray - N/A EKG - 02/09/24 Epic Stress Test - 02/13/24 Epic Cardiac CT- 12/28/23 Epic ECHO - N/A Cardiac Cath - N/A Pacemaker/ICD device last checked:N/A Spinal Cord Stimulator:N/A  Bowel Prep - fleet enema night before  Sleep Study - N/A CPAP -   Fasting Blood Sugar - preDM Checks Blood Sugar _____ times a day  Last dose of GLP1 agonist-  N/A GLP1 instructions:  Do not take after     Last dose of SGLT-2 inhibitors-  N/A SGLT-2 instructions:  Do not take after     Blood Thinner Instructions: N/A Last dose:   Time: Aspirin  Instructions: ASA 81, hold 7 days Last Dose:  Activity level: Can go up a flight of stairs and perform activities of daily living without stopping and without symptoms of chest pain or shortness of breath.  Anesthesia review: aortic atherosclerosis, CAD, HTN  Patient denies shortness of breath, fever, cough and chest pain at PAT appointment  Patient verbalized understanding of instructions that were given to them at the PAT appointment. Patient was also instructed that they will need to review over the PAT instructions again at home before surgery.

## 2024-05-07 NOTE — Patient Instructions (Signed)
 SURGICAL WAITING ROOM VISITATION  Patients having surgery or a procedure may have no more than 2 support people in the waiting area - these visitors may rotate.    Children ages 65 and under will not be able to visit patients in [facility] under most circumstances.   Visitors with respiratory illnesses are discouraged from visiting and should remain at home.  If the patient needs to stay at the hospital during part of their recovery, the visitor guidelines for inpatient rooms apply. Pre-op nurse will coordinate an appropriate time for 1 support person to accompany patient in pre-op.  This support person may not rotate.    Please refer to the [facility] website for the visitor guidelines for Inpatients (after your surgery is over and you are in a regular room).    Your procedure is scheduled on: 05/15/24   Report to [facility] Main Entrance    Report to admitting at 7:15 AM   Call this number if you have problems the morning of surgery [phone]   Do not eat food or drink liquids :After Midnight.         If you have questions, please contact your surgeons office.   FOLLOW BOWEL PREP AND ANY ADDITIONAL PRE OP INSTRUCTIONS YOU RECEIVED FROM YOUR SURGEON'S OFFICE!!!     Oral Hygiene is also important to reduce your risk of infection.                                    Remember - BRUSH YOUR TEETH THE MORNING OF SURGERY WITH YOUR REGULAR TOOTHPASTE  DENTURES WILL BE REMOVED PRIOR TO SURGERY PLEASE DO NOT APPLY Poly grip OR ADHESIVES!!!   Stop all vitamins and herbal supplements 7 days before surgery.   Take these medicines the morning of surgery with A SIP OF WATER: Atorvastatin                               You may not have any metal on your body including jewelry, and body piercing             Do not wear lotions, powders, cologne, or deodorant              Men may shave face and neck.   Do not bring valuables to the hospital. [facility] IS NOT              RESPONSIBLE   FOR VALUABLES.   Contacts, glasses, dentures or bridgework may not be worn into surgery.   Bring small overnight bag day of surgery.   DO NOT BRING YOUR HOME MEDICATIONS TO THE HOSPITAL. PHARMACY WILL DISPENSE MEDICATIONS LISTED ON YOUR MEDICATION LIST TO YOU DURING YOUR ADMISSION IN THE HOSPITAL!    Patients discharged on the day of surgery will not be allowed to drive home.  Someone NEEDS to stay with you for the first 24 hours after anesthesia.   Special Instructions: Bring a copy of your healthcare power of attorney and living will documents the day of surgery if you haven't scanned them before.              Please read over the following fact sheets you were given: IF YOU HAVE QUESTIONS ABOUT YOUR PRE-OP INSTRUCTIONS PLEASE CALL [phone] [place].   If you received a COVID test during your pre-op visit  it is  requested that you wear a mask when out in public, stay away from anyone that may not be feeling well and notify your surgeon if you develop symptoms. If you test positive for Covid or have been in contact with anyone that has tested positive in the last 10 days please notify you surgeon.    [place] - Preparing for Surgery Before surgery, you can play an important role.  Because skin is not sterile, your skin needs to be as free of germs as possible.  You can reduce the number of germs on your skin by washing with CHG (chlorahexidine gluconate) soap before surgery.  CHG is an antiseptic cleaner which kills germs and bonds with the skin to continue killing germs even after washing. Please DO NOT use if you have an allergy to CHG or antibacterial soaps.  If your skin becomes reddened/irritated stop using the CHG and inform your nurse when you arrive at Short Stay. Do not shave (including legs and underarms) for at least 48 hours prior to the first CHG shower.  You may shave your face/neck.  Please follow these instructions carefully:  1.  Shower with CHG  Soap the night before surgery ONLY (DO NOT USE THE SOAP THE MORNING OF SURGERY).  2.  If you choose to wash your hair, wash your hair first as usual with your normal  shampoo.  3.  After you shampoo, rinse your hair and body thoroughly to remove the shampoo.                             4.  Use CHG as you would any other liquid soap.  You can apply chg directly to the skin and wash.  Gently with a scrungie or clean washcloth.  5.  Apply the CHG Soap to your body ONLY FROM THE NECK DOWN.   Do   not use on face/ open                           Wound or open sores. Avoid contact with eyes, ears mouth and   genitals (private parts).                       Wash face,  Genitals (private parts) with your normal soap.             6.  Wash thoroughly, paying special attention to the area where your    surgery  will be performed.  7.  Thoroughly rinse your body with warm water from the neck down.  8.  DO NOT shower/wash with your normal soap after using and rinsing off the CHG Soap.                9.  Pat yourself dry with a clean towel.            10.  Wear clean pajamas.            11.  Place clean sheets on your bed the night of your first shower and do not  sleep with pets. Day of Surgery : Do not apply any CHG, lotions/deodorants the morning of surgery.  Please wear clean clothes to the hospital/surgery center.  FAILURE TO FOLLOW THESE INSTRUCTIONS MAY RESULT IN THE CANCELLATION OF YOUR SURGERY  PATIENT SIGNATURE_________________________________  NURSE SIGNATURE__________________________________  ________________________________________________________________________

## 2024-05-08 ENCOUNTER — Encounter (HOSPITAL_COMMUNITY): Payer: Self-pay

## 2024-05-08 ENCOUNTER — Encounter (HOSPITAL_COMMUNITY)
Admission: RE | Admit: 2024-05-08 | Discharge: 2024-05-08 | Disposition: A | Source: Ambulatory Visit | Attending: Urology

## 2024-05-08 ENCOUNTER — Other Ambulatory Visit: Payer: Self-pay

## 2024-05-08 VITALS — BP 140/98 | HR 67 | Temp 98.4°F | Resp 16 | Ht 70.0 in | Wt 204.0 lb

## 2024-05-08 DIAGNOSIS — I10 Essential (primary) hypertension: Secondary | ICD-10-CM | POA: Insufficient documentation

## 2024-05-08 DIAGNOSIS — C61 Malignant neoplasm of prostate: Secondary | ICD-10-CM | POA: Insufficient documentation

## 2024-05-08 DIAGNOSIS — Z01812 Encounter for preprocedural laboratory examination: Secondary | ICD-10-CM | POA: Diagnosis present

## 2024-05-08 DIAGNOSIS — I25118 Atherosclerotic heart disease of native coronary artery with other forms of angina pectoris: Secondary | ICD-10-CM | POA: Insufficient documentation

## 2024-05-08 LAB — CBC
HCT: 43.7 % (ref 39.0–52.0)
Hemoglobin: 15 g/dL (ref 13.0–17.0)
MCH: 32 pg (ref 26.0–34.0)
MCHC: 34.3 g/dL (ref 30.0–36.0)
MCV: 93.2 fL (ref 80.0–100.0)
Platelets: 290 K/uL (ref 150–400)
RBC: 4.69 MIL/uL (ref 4.22–5.81)
RDW: 12.4 % (ref 11.5–15.5)
WBC: 5.6 K/uL (ref 4.0–10.5)
nRBC: 0 % (ref 0.0–0.2)

## 2024-05-08 LAB — BASIC METABOLIC PANEL WITH GFR
Anion gap: 11 (ref 5–15)
BUN: 16 mg/dL (ref 8–23)
CO2: 23 mmol/L (ref 22–32)
Calcium: 9.3 mg/dL (ref 8.9–10.3)
Chloride: 105 mmol/L (ref 98–111)
Creatinine, Ser: 0.77 mg/dL (ref 0.61–1.24)
GFR, Estimated: >60 mL/min
Glucose, Bld: 102 mg/dL — ABNORMAL HIGH (ref 70–99)
Potassium: 4.2 mmol/L (ref 3.5–5.1)
Sodium: 139 mmol/L (ref 135–145)

## 2024-05-09 ENCOUNTER — Encounter (HOSPITAL_COMMUNITY): Payer: Self-pay

## 2024-05-09 NOTE — Progress Notes (Signed)
 " Case: [id number] Date/Time: 05/15/24 0915   Procedures:      INSERTION, RADIATION SOURCE, PROSTATE - RADIOACTIVE SEED IMPLANTS AND SPACEOAR INSTILLATION     INJECTION, HYDROGEL SPACER   Anesthesia type: General   Diagnosis: Prostate cancer (HCC) [C61]   Pre-op diagnosis: PROSTATE CANCER   [facility] 08 [facility]   Surgeons: Devere Lonni Righter, MD       DISCUSSION: Adam Rosales is a 74 yo male with PMH of HTN, CAD (by imaging), prostate cancer.  Patient seen by Cardiology on October 2025 due to significant coronary calcifications. He underwent stress testing which came back normal and low risk. Seen in follow up by Dr. Elmira on 03/29/24. Medical therapy for CAD recommended. He was cleared for surgery as low risk.  VS: BP (!) 140/98   Pulse 67   Temp 36.9 C (Oral)   Resp 16   Ht 5' 10 (1.778 m)   Wt 92.5 kg   SpO2 99%   BMI 29.27 kg/m   PROVIDERS: Vita Morrow, MD   LABS: Labs reviewed: Acceptable for surgery. (all labs ordered are listed, but only abnormal results are displayed)  Labs Reviewed  BASIC METABOLIC PANEL WITH GFR - Abnormal; Notable for the following components:      Result Value   Glucose, Bld 102 (*)    All other components within normal limits  CBC    CT chest overread 02/13/24:  IMPRESSION: 1. No acute extracardiac abnormality. 2. Aortic atherosclerosis. 3. Coronary artery calcifications.   Aortic Atherosclerosis (ICD10-I70.0).  EKG 02/09/24:  Sinus rhythm Nonspecific intraventricular conduction delay Nonspecific T wave abnormality   Lexiscan stress test 02/13/24:    The study is normal. The study is low risk.   LV perfusion is normal. There is no evidence of ischemia. There is no evidence of infarction.   Left ventricular function is normal. Nuclear stress EF: 58%. The left ventricular ejection fraction is normal (55-65%). End diastolic cavity size is normal.   Coronary calcium  was present on the attenuation correction  CT images. Moderate coronary calcifications were present. Coronary calcifications were present in the left anterior descending artery and left circumflex artery distribution(s).  CT calcium  score 12/28/23:   IMPRESSION: Coronary calcium  score of 647 this was 75th percentile for age-, race-, and sex-matched controls.   Mildly dilated ascending aorta measuring 39mm at the level of the bifurcation of the main pulmonary artery.   Past Medical History:  Diagnosis Date   Cellulitis of buttock 2022   Dyslipidemia    Elevated PSA    History of kidney stones    Hyperlipidemia    Hypertension    Pre-diabetes    Scrotal abscess 2022    Past Surgical History:  Procedure Laterality Date   COLONOSCOPY  2009   Dr.Mann   INGUINAL HERNIA REPAIR Left    INGUINAL HERNIA REPAIR Right 12/16/2020   Procedure: RECURRENT OPEN RIGHT INGUINAL HERNIA REPAIR WITH MESH;  Surgeon: Lyndel Deward PARAS, MD;  Location[facility];  Service: General;  Laterality: Right;   PROSTATE BIOPSY  06/06/2023   SHX1 Right 05/01/2021   junctional nevus with moderate atypia   WISDOM TOOTH EXTRACTION      MEDICATIONS:  aspirin  EC 81 MG tablet   atorvastatin  (LIPITOR) 80 MG tablet   ibuprofen  (ADVIL ) 200 MG tablet   lisinopril  (ZESTRIL ) 10 MG tablet   Multiple Vitamin (MULTIVITAMIN) capsule   nitroGLYCERIN  (NITROSTAT ) 0.4 MG SL tablet   tadalafil (CIALIS) 5 MG tablet  No current facility-administered medications for this encounter.   [name] Short Stay/Anesthesiology [facility] Phone [phone] 05/09/2024 11:03 AM         "

## 2024-05-09 NOTE — Anesthesia Preprocedure Evaluation (Addendum)
 "                                  Anesthesia Evaluation  Patient identified by MRN, date of birth, ID band Patient awake    Reviewed: Allergy & Precautions, NPO status , Patient's Chart, lab work & pertinent test results  Airway Mallampati: I  TM Distance: >3 FB     Dental  (+) Teeth Intact, Dental Advisory Given, Caps   Pulmonary neg pulmonary ROS   Pulmonary exam normal breath sounds clear to auscultation       Cardiovascular hypertension, Pt. on medications + angina  + CAD  Normal cardiovascular exam Rhythm:Regular Rate:Normal  EKG 02/09/24 Sinus rhythm 59 bpm  Non-specific intra-ventricular conduction delay  Nonspecific T wave abnormality   Neuro/Psych   Anxiety     negative neurological ROS     GI/Hepatic negative GI ROS, Neg liver ROS,,,  Endo/Other  HLD  Renal/GU Renal diseaseHx/o renal calculi Lab Results      Component                Value               Date                      NA                       139                 05/08/2024                CL                       105                 05/08/2024                K                        4.2                 05/08/2024                CO2                      23                  05/08/2024                BUN                      16                  05/08/2024                CREATININE               0.77                05/08/2024                GFRNONAA                 >60                 05/08/2024  CALCIUM                   9.3                 05/08/2024                ALBUMIN                  4.7                 02/09/2023                GLUCOSE                  102 (H)             05/08/2024              Prostate Ca    Musculoskeletal negative musculoskeletal ROS (+)    Abdominal Normal abdominal exam  (+)   Peds  Hematology negative hematology ROS (+) Lab Results      Component                Value               Date                      WBC                       5.6                 05/08/2024                HGB                      15.0                05/08/2024                HCT                      43.7                05/08/2024                MCV                      93.2                05/08/2024                PLT                      290                 05/08/2024              Anesthesia Other Findings   Reproductive/Obstetrics ED                              Anesthesia Physical Anesthesia Plan  ASA: 3  Anesthesia Plan: General   Post-op Pain Management: Minimal or no pain anticipated   Induction: Intravenous  PONV Risk Score and Plan: 3 and Treatment may vary due to age or medical condition, Ondansetron  and Dexamethasone   Airway Management Planned: Oral ETT  Additional Equipment: None  Intra-op  Plan:   Post-operative Plan: Extubation in OR  Informed Consent: I have reviewed the patients History and Physical, chart, labs and discussed the procedure including the risks, benefits and alternatives for the proposed anesthesia with the patient or authorized representative who has indicated his/her understanding and acceptance.     Dental advisory given  Plan Discussed with: CRNA and Anesthesiologist  Anesthesia Plan Comments: (See PAT note from 1/13)         Anesthesia Quick Evaluation  "

## 2024-05-14 ENCOUNTER — Telehealth: Payer: Self-pay | Admitting: *Deleted

## 2024-05-14 NOTE — Telephone Encounter (Signed)
 CALLED PATIENT TO  REMIND OF PROCEDURE FOR 05-15-24, LVM FOR A RETURN CALL

## 2024-05-15 ENCOUNTER — Encounter (HOSPITAL_COMMUNITY): Payer: Self-pay | Admitting: Urology

## 2024-05-15 ENCOUNTER — Ambulatory Visit (HOSPITAL_COMMUNITY)
Admission: RE | Admit: 2024-05-15 | Discharge: 2024-05-15 | Disposition: A | Discharge status: Home / Self Care | Source: Ambulatory Visit | Attending: Urology | Admitting: Urology

## 2024-05-15 ENCOUNTER — Ambulatory Visit (HOSPITAL_COMMUNITY)

## 2024-05-15 ENCOUNTER — Encounter (HOSPITAL_COMMUNITY)
Admission: RE | Disposition: A | Discharge status: Home / Self Care | Payer: Self-pay | Source: Ambulatory Visit | Attending: Urology

## 2024-05-15 ENCOUNTER — Other Ambulatory Visit: Payer: Self-pay

## 2024-05-15 ENCOUNTER — Ambulatory Visit (HOSPITAL_COMMUNITY): Admitting: Anesthesiology

## 2024-05-15 ENCOUNTER — Ambulatory Visit (HOSPITAL_COMMUNITY): Payer: Self-pay | Admitting: Medical

## 2024-05-15 DIAGNOSIS — I2510 Atherosclerotic heart disease of native coronary artery without angina pectoris: Secondary | ICD-10-CM

## 2024-05-15 DIAGNOSIS — F419 Anxiety disorder, unspecified: Secondary | ICD-10-CM | POA: Diagnosis not present

## 2024-05-15 DIAGNOSIS — Z79899 Other long term (current) drug therapy: Secondary | ICD-10-CM | POA: Insufficient documentation

## 2024-05-15 DIAGNOSIS — C61 Malignant neoplasm of prostate: Secondary | ICD-10-CM | POA: Diagnosis not present

## 2024-05-15 DIAGNOSIS — I10 Essential (primary) hypertension: Secondary | ICD-10-CM | POA: Insufficient documentation

## 2024-05-15 DIAGNOSIS — I25119 Atherosclerotic heart disease of native coronary artery with unspecified angina pectoris: Secondary | ICD-10-CM | POA: Insufficient documentation

## 2024-05-15 DIAGNOSIS — E785 Hyperlipidemia, unspecified: Secondary | ICD-10-CM | POA: Diagnosis not present

## 2024-05-15 HISTORY — PX: RADIOACTIVE SEED IMPLANT: SHX5150

## 2024-05-15 HISTORY — PX: SPACE OAR INSTILLATION: SHX6769

## 2024-05-15 MED ORDER — DROPERIDOL 2.5 MG/ML IJ SOLN: 0.6250 mg | Freq: Once | INTRAMUSCULAR | Status: DC | PRN

## 2024-05-15 MED ORDER — OXYCODONE HCL 5 MG/5ML PO SOLN: 5.0000 mg | Freq: Once | ORAL | Status: AC | PRN

## 2024-05-15 MED ORDER — FENTANYL CITRATE (PF) 100 MCG/2ML IJ SOLN
INTRAMUSCULAR | Status: AC
  Filled 2024-05-15: qty 2

## 2024-05-15 MED ORDER — ONDANSETRON HCL 4 MG/2ML IJ SOLN: 4.0000 mg | Freq: Once | INTRAMUSCULAR | Status: DC | PRN

## 2024-05-15 MED ORDER — IOHEXOL 300 MG/ML  SOLN
INTRAMUSCULAR | Status: DC | PRN
  Administered 2024-05-15: 7 mL

## 2024-05-15 MED ORDER — OXYCODONE HCL 5 MG PO TABS
5.0000 mg | ORAL_TABLET | Freq: Once | ORAL | Status: AC | PRN
  Administered 2024-05-15: 5 mg via ORAL

## 2024-05-15 MED ORDER — EPHEDRINE SULFATE-NACL 50-0.9 MG/10ML-% IV SOSY
PREFILLED_SYRINGE | INTRAVENOUS | Status: DC | PRN
  Administered 2024-05-15: 10 mg via INTRAVENOUS

## 2024-05-15 MED ORDER — PROPOFOL 10 MG/ML IV BOLUS
INTRAVENOUS | Status: DC | PRN
  Administered 2024-05-15: 160 mg via INTRAVENOUS

## 2024-05-15 MED ORDER — SODIUM CHLORIDE (PF) 0.9 % IJ SOLN
INTRAMUSCULAR | Status: DC | PRN
  Administered 2024-05-15: 10 mL

## 2024-05-15 MED ORDER — SUGAMMADEX SODIUM 200 MG/2ML IV SOLN
INTRAVENOUS | Status: DC | PRN
  Administered 2024-05-15: 200 mg via INTRAVENOUS

## 2024-05-15 MED ORDER — CHLORHEXIDINE GLUCONATE 0.12 % MT SOLN
15.0000 mL | Freq: Once | OROMUCOSAL | Status: AC
  Administered 2024-05-15: 15 mL via OROMUCOSAL

## 2024-05-15 MED ORDER — LIDOCAINE 2% (20 MG/ML) 5 ML SYRINGE
INTRAMUSCULAR | Status: DC | PRN
  Administered 2024-05-15: 80 mg via INTRAVENOUS

## 2024-05-15 MED ORDER — SODIUM CHLORIDE 0.9 % IV SOLN
2.0000 g | Freq: Once | INTRAVENOUS | Status: AC
  Administered 2024-05-15: 2 g via INTRAVENOUS
  Filled 2024-05-15: qty 20

## 2024-05-15 MED ORDER — OXYCODONE HCL 5 MG PO TABS
ORAL_TABLET | ORAL | Status: AC
  Filled 2024-05-15: qty 1

## 2024-05-15 MED ORDER — SODIUM CHLORIDE (PF) 0.9 % IJ SOLN
INTRAMUSCULAR | Status: AC
  Filled 2024-05-15: qty 10

## 2024-05-15 MED ORDER — LACTATED RINGERS IV SOLN: INTRAVENOUS | Status: DC

## 2024-05-15 MED ORDER — FENTANYL CITRATE (PF) 100 MCG/2ML IJ SOLN
INTRAMUSCULAR | Status: DC | PRN
  Administered 2024-05-15: 100 ug via INTRAVENOUS

## 2024-05-15 MED ORDER — SODIUM CHLORIDE 0.9 % IR SOLN
Status: DC | PRN
  Administered 2024-05-15: 1000 mL

## 2024-05-15 MED ORDER — DEXAMETHASONE SOD PHOSPHATE PF 10 MG/ML IJ SOLN
INTRAMUSCULAR | Status: DC | PRN
  Administered 2024-05-15: 10 mg via INTRAVENOUS

## 2024-05-15 MED ORDER — ONDANSETRON HCL 4 MG/2ML IJ SOLN
INTRAMUSCULAR | Status: DC | PRN
  Administered 2024-05-15: 4 mg via INTRAVENOUS

## 2024-05-15 MED ORDER — PHENYLEPHRINE 80 MCG/ML (10ML) SYRINGE FOR IV PUSH (FOR BLOOD PRESSURE SUPPORT)
PREFILLED_SYRINGE | INTRAVENOUS | Status: DC | PRN
  Administered 2024-05-15: 80 ug via INTRAVENOUS
  Administered 2024-05-15: 160 ug via INTRAVENOUS

## 2024-05-15 MED ORDER — ORAL CARE MOUTH RINSE: 15.0000 mL | Freq: Once | OROMUCOSAL | Status: AC

## 2024-05-15 MED ORDER — FLEET ENEMA RE ENEM: 1.0000 | ENEMA | Freq: Once | RECTAL | Status: DC

## 2024-05-15 MED ORDER — FENTANYL CITRATE (PF) 50 MCG/ML IJ SOSY: 25.0000 ug | PREFILLED_SYRINGE | INTRAMUSCULAR | Status: DC | PRN

## 2024-05-15 MED ORDER — STERILE WATER FOR IRRIGATION IR SOLN
Status: DC | PRN
  Administered 2024-05-15: 1000 mL

## 2024-05-15 MED ORDER — ROCURONIUM BROMIDE 10 MG/ML (PF) SYRINGE
PREFILLED_SYRINGE | INTRAVENOUS | Status: DC | PRN
  Administered 2024-05-15: 60 mg via INTRAVENOUS
  Administered 2024-05-15: 20 mg via INTRAVENOUS

## 2024-05-15 NOTE — Op Note (Signed)
 PATIENT:  Adam Rosales  PRE-OPERATIVE DIAGNOSIS:  Adenocarcinoma of the prostate  POST-OPERATIVE DIAGNOSIS:  Same  PROCEDURE:  1. I-125 radioactive seed implantation 2. Cystoscopy  3. Placement of SpaceOAR  SURGEON:  Lonni Han, MD  Radiation oncologist: Donnice Barge, MD  ANESTHESIA:  General  EBL:  Minimal  DRAINS: None  INDICATION: [name] is a 74 y.o. male with grade 2 prostate cancer.  He is here today for brachytherapy seed and SpaceOAR placement as primary treatment of his prostate cancer. He has been consented for the above procedures, voices understanding and wishes to proceed.   Description of procedure: After informed consent the patient was brought to the major OR, placed on the table and administered general anesthesia. He was then moved to the modified lithotomy position with his perineum perpendicular to the floor. His perineum and genitalia were then sterilely prepped. An official timeout was then performed. A 16 French Foley catheter was then placed in the bladder and filled with dilute contrast, a rectal tube was placed in the rectum and the transrectal ultrasound probe was placed in the rectum and affixed to the stand. He was then sterilely draped.  Real time ultrasonography was used along with the seed planning software. This was used to develop the seed plan including the number of needles as well as number of seeds required for complete and adequate coverage. Real-time ultrasonography was then used along with the previously developed plan and the Nucletron device to implant a total of 68 seeds using 17 needles. This proceeded without difficulty or complication.   I then proceeded with placement of SpaceOAR by introducing a needle with the bevel angled inferiorly approximately 2 cm superior to the anus. This was angled downward and under direct ultrasound was placed within the space between the prostatic capsule and rectum. This was confirmed  with a small amount of sterile saline injected and this was performed under direct ultrasound. I then attached the SpaceOAR to the needle and injected this in the space between the prostate and rectum with good placement noted.  A Foley catheter was then removed as well as the transrectal ultrasound probe and rectal probe. Flexible cystoscopy was then performed using the 16 French flexible scope which revealed a normal urethra throughout its length down to the sphincter which appeared intact. The prostatic urethra revealed bilobar hypertrophy but no evidence of obstruction, seeds, spacers or lesions. The bladder was then entered and fully and systematically inspected. The ureteral orifices were noted to be of normal configuration and position. The mucosa revealed no evidence of tumors. There were also no stones identified within the bladder. I noted no seeds or spacers on the floor of the bladder and retroflexion of the scope revealed no seeds protruding from the base of the prostate.  The cystoscope was then removed and the patient was awakened and taken to recovery room in stable and satisfactory condition. He tolerated procedure well and there were no intraoperative complications.  PLAN:  Check-up in 1 months.  PSA and MD f/u in 4 months.

## 2024-05-15 NOTE — Anesthesia Postprocedure Evaluation (Signed)
"   Anesthesia Post Note  Patient: Adam Rosales  Procedure(s) Performed: INSERTION, RADIATION SOURCE, PROSTATE INJECTION, HYDROGEL SPACER     Patient location during evaluation: PACU Anesthesia Type: General Level of consciousness: awake and alert and oriented Pain management: pain level controlled Vital Signs Assessment: post-procedure vital signs reviewed and stable Respiratory status: spontaneous breathing, nonlabored ventilation and respiratory function stable Cardiovascular status: blood pressure returned to baseline and stable Postop Assessment: no apparent nausea or vomiting Anesthetic complications: no   No notable events documented.  Last Vitals:  Vitals:   05/15/24 1230 05/15/24 1246  BP: 133/81 (!) 154/85  Pulse: (!) 52 (!) 55  Resp: 13 13  Temp:    SpO2: 100% 100%    Last Pain:  Vitals:   05/15/24 1246  TempSrc:   PainSc: 2                  Evalisse Prajapati A.      "

## 2024-05-15 NOTE — Transfer of Care (Signed)
 Immediate Anesthesia Transfer of Care Note  Patient: Adam Rosales  Procedure(s) Performed: INSERTION, RADIATION SOURCE, PROSTATE INJECTION, HYDROGEL SPACER  Patient Location: PACU  Anesthesia Type:General  Level of Consciousness: awake, alert , and oriented  Airway & Oxygen Therapy: Patient Spontanous Breathing and Patient connected to face mask oxygen  Post-op Assessment: Report given to RN and Post -op Vital signs reviewed and stable  Post vital signs: Reviewed and stable  Last Vitals:  Vitals Value Taken Time  BP 129/76 05/15/24 11:48  Temp    Pulse 66 05/15/24 11:49  Resp 8 05/15/24 11:49  SpO2 100 % 05/15/24 11:49  Vitals shown include unfiled device data.  Last Pain:  Vitals:   05/15/24 0739  TempSrc: Oral         Complications: No notable events documented.

## 2024-05-15 NOTE — Interval H&P Note (Signed)
 History and Physical Interval Note:  05/15/2024 8:54 AM  Adam Rosales  has presented today for surgery, with the diagnosis of PROSTATE CANCER.  The various methods of treatment have been discussed with the patient and family. After consideration of risks, benefits and other options for treatment, the patient has consented to  Procedures with comments: INSERTION, RADIATION SOURCE, PROSTATE (N/A) - RADIOACTIVE SEED IMPLANTS AND SPACEOAR INSTILLATION INJECTION, HYDROGEL SPACER (N/A) as a surgical intervention.  The patient's history has been reviewed, patient examined, no change in status, stable for surgery.  I have reviewed the patient's chart and labs.  Questions were answered to the patient's satisfaction.     Lonni Righter Sparrow Sanzo

## 2024-05-15 NOTE — Discharge Instructions (Signed)
 Adam Rosales

## 2024-05-15 NOTE — Anesthesia Procedure Notes (Signed)
 Procedure Name: Intubation Date/Time: 05/15/2024 10:21 AM  Performed by: Cindie Charleen PARAS, CRNAPre-anesthesia Checklist: Patient identified, Emergency Drugs available, Suction available, Patient being monitored and Timeout performed Patient Re-evaluated:Patient Re-evaluated prior to induction Oxygen Delivery Method: Circle system utilized Preoxygenation: Pre-oxygenation with 100% oxygen Induction Type: IV induction Ventilation: Mask ventilation without difficulty Laryngoscope Size: Mac and 4 Grade View: Grade I Tube type: Oral Tube size: 7.5 mm Number of attempts: 1 Airway Equipment and Method: Stylet Placement Confirmation: ETT inserted through vocal cords under direct vision, positive ETCO2 and breath sounds checked- equal and bilateral Secured at: 23 cm Tube secured with: Tape Dental Injury: Teeth and Oropharynx as per pre-operative assessment

## 2024-05-16 ENCOUNTER — Encounter (HOSPITAL_COMMUNITY): Payer: Self-pay | Admitting: Urology

## 2024-05-17 ENCOUNTER — Other Ambulatory Visit: Payer: Self-pay | Admitting: Urology

## 2024-05-17 DIAGNOSIS — C61 Malignant neoplasm of prostate: Secondary | ICD-10-CM

## 2024-05-17 NOTE — Progress Notes (Signed)
" °  Radiation Oncology         [phone] ________________________________  Name: NADIR VASQUES MRN: [id number]  Date: 05/17/2024  DOB: [birth year]       Prostate Seed Implant  CC:Jha, Reyne, MD  No ref. provider found  DIAGNOSIS:  Oncology History  Malignant neoplasm of prostate (HCC)  06/26/2023 Initial Diagnosis   Malignant neoplasm of prostate (HCC)   02/16/2024 Cancer Staging   Staging form: Prostate, AJCC 8th Edition - Clinical stage from 02/16/2024: Stage IIB (cT1c, cN0, cM0, PSA: 5.6, Grade Group: 2) - Signed by Sherwood Rise, PA-C on 03/15/2024 Histopathologic type: Adenocarcinoma, NOS Stage prefix: Initial diagnosis Prostate specific antigen (PSA) range: Less than 10 Gleason primary pattern: 3 Gleason secondary pattern: 4 Gleason score: 7 Histologic grading system: 5 grade system Number of biopsy cores examined: 16 Number of biopsy cores positive: 3 Location of positive needle core biopsies: Both sides     No diagnosis found.  PROCEDURE: Insertion of radioactive I-125 seeds into the prostate gland.  RADIATION DOSE: 145 Gy, definitive therapy.  TECHNIQUE: AHREN PETTINGER was brought to the operating room with the urologist. He was placed in the dorsolithotomy position. He was catheterized and a rectal tube was inserted. The perineum was shaved, prepped and draped. The ultrasound probe was then introduced by me into the rectum to see the prostate gland.  TREATMENT DEVICE: I attached the needle grid to the ultrasound probe stand and anchor needles were placed.  3D PLANNING: The prostate was imaged in 3D using a sagittal sweep of the prostate probe. These images were transferred to the planning computer. There, the prostate, urethra and rectum were defined on each axial reconstructed image. Then, the software created an optimized 3D plan and a few seed positions were adjusted. The quality of the plan was reviewed using [facility] information for the target and the following  two organs at risk:  Urethra and Rectum.  Then the accepted plan was printed and handed off to the radiation therapist.  Under my supervision, the custom loading of the seeds and spacers was carried out using the quick loader.  These pre-loaded needles were then placed into the needle holder.SABRA  PROSTATE VOLUME STUDY:  Using transrectal ultrasound the volume of the prostate was verified to be 51.6 cc.  SPECIAL TREATMENT PROCEDURE/SUPERVISION AND HANDLING: The pre-loaded needles were then delivered by the urologist under sagittal guidance. A total of 17 needles were used to deposit 68 seeds in the prostate gland. The individual seed activity was 0.511 mCi.  Rectal Spacer:  Yes, SpaceOAR  COMPLEX SIMULATION: At the end of the procedure, an anterior radiograph of the pelvis was obtained to document seed positioning and count. Cystoscopy was performed by the urologist to check the urethra and bladder.  MICRODOSIMETRY: At the end of the procedure, the patient was emitting 0.11 mR/hr at 1 meter. Accordingly, he was considered safe for hospital discharge.  PLAN: The patient will return to the radiation oncology clinic for post implant CT dosimetry in three weeks.   ________________________________  Donnice FELIX Patrcia, M.D.    "

## 2024-05-21 NOTE — Progress Notes (Signed)
 Patient was a RadOnc Consult on 06/27/23 for his stage T1c adenocarcinoma of the prostate with Gleason Score of 3+3, and PSA of 4.93.   Patient proceed with treatment recommendations of brachytherapy and had his treatment on 05/15/24.   Patient is scheduled for a post seed CT Simulation on 06/06/24 and post op urology follow up on 06/15/24.

## 2024-05-30 NOTE — Progress Notes (Incomplete)
 Post-seed nursing interview for a diagnosis of 74 y.o. gentleman with Stage T1c adenocarcinoma of the prostate with Gleason score of 3+3, and PSA of 4.93.    Patient identity verified x2.   Patient states issues as follows...  -Pain: *** -Fatigue: *** -Abdomen: *** -Groin: *** -Urinary: *** -Bowels: *** -Appetite: ***  Patient denies all other related issues at this time.  Meaningful use complete.  I-PSS (AUA) score- *** - {(BH) RANGE ABSENT/SEVERE:20013:s} SHIM (ED) score- *** Urinary Management medication(s): None Urology appointment date- 06/15/24 with Dr. Lonni Han   Vitals- ***  This concludes the interaction.

## 2024-06-06 ENCOUNTER — Ambulatory Visit: Admitting: Radiation Oncology

## 2024-06-06 ENCOUNTER — Ambulatory Visit (HOSPITAL_COMMUNITY)

## 2024-06-06 ENCOUNTER — Ambulatory Visit: Admitting: Urology

## 2024-11-13 ENCOUNTER — Ambulatory Visit: Payer: Self-pay
